# Patient Record
Sex: Female | Born: 1955 | Hispanic: Yes | Marital: Married | Smoking: Never smoker
Health system: Southern US, Community
[De-identification: ages and names within clinical notes are randomized; demographics above are authoritative.]

## PROBLEM LIST (undated history)

## (undated) DIAGNOSIS — R51 Headache: Secondary | ICD-10-CM

## (undated) DIAGNOSIS — H539 Unspecified visual disturbance: Secondary | ICD-10-CM

## (undated) DIAGNOSIS — N1832 Chronic kidney disease, stage 3b: Secondary | ICD-10-CM

## (undated) DIAGNOSIS — E119 Type 2 diabetes mellitus without complications: Secondary | ICD-10-CM

## (undated) DIAGNOSIS — E7800 Pure hypercholesterolemia, unspecified: Secondary | ICD-10-CM

## (undated) DIAGNOSIS — Z8616 Personal history of COVID-19: Secondary | ICD-10-CM

## (undated) DIAGNOSIS — G479 Sleep disorder, unspecified: Secondary | ICD-10-CM

## (undated) DIAGNOSIS — E538 Deficiency of other specified B group vitamins: Secondary | ICD-10-CM

## (undated) DIAGNOSIS — R519 Headache, unspecified: Secondary | ICD-10-CM

## (undated) DIAGNOSIS — I10 Essential (primary) hypertension: Secondary | ICD-10-CM

## (undated) HISTORY — PX: CHOLECYSTECTOMY: SHX55

## (undated) HISTORY — PX: TUBAL LIGATION: SHX77

---

## 2012-03-21 ENCOUNTER — Ambulatory Visit: Payer: Self-pay | Admitting: Family Medicine

## 2014-04-23 ENCOUNTER — Emergency Department: Payer: Self-pay | Admitting: Emergency Medicine

## 2014-05-02 ENCOUNTER — Emergency Department: Payer: Self-pay | Admitting: Emergency Medicine

## 2016-09-01 DIAGNOSIS — Z01419 Encounter for gynecological examination (general) (routine) without abnormal findings: Secondary | ICD-10-CM | POA: Diagnosis not present

## 2016-09-01 DIAGNOSIS — E782 Mixed hyperlipidemia: Secondary | ICD-10-CM | POA: Diagnosis not present

## 2016-09-01 DIAGNOSIS — Z23 Encounter for immunization: Secondary | ICD-10-CM | POA: Diagnosis not present

## 2016-09-01 DIAGNOSIS — E119 Type 2 diabetes mellitus without complications: Secondary | ICD-10-CM | POA: Diagnosis not present

## 2016-09-01 DIAGNOSIS — I10 Essential (primary) hypertension: Secondary | ICD-10-CM | POA: Diagnosis not present

## 2016-09-01 DIAGNOSIS — Z7689 Persons encountering health services in other specified circumstances: Secondary | ICD-10-CM | POA: Diagnosis not present

## 2016-09-06 ENCOUNTER — Other Ambulatory Visit: Payer: Self-pay | Admitting: Internal Medicine

## 2016-09-06 DIAGNOSIS — Z1231 Encounter for screening mammogram for malignant neoplasm of breast: Secondary | ICD-10-CM

## 2016-10-04 ENCOUNTER — Ambulatory Visit: Payer: Self-pay | Attending: Internal Medicine

## 2016-11-08 DIAGNOSIS — I10 Essential (primary) hypertension: Secondary | ICD-10-CM | POA: Diagnosis not present

## 2016-11-08 DIAGNOSIS — K0889 Other specified disorders of teeth and supporting structures: Secondary | ICD-10-CM | POA: Diagnosis not present

## 2016-12-01 DIAGNOSIS — Z23 Encounter for immunization: Secondary | ICD-10-CM | POA: Diagnosis not present

## 2016-12-01 DIAGNOSIS — E7800 Pure hypercholesterolemia, unspecified: Secondary | ICD-10-CM | POA: Diagnosis not present

## 2016-12-01 DIAGNOSIS — E119 Type 2 diabetes mellitus without complications: Secondary | ICD-10-CM | POA: Diagnosis not present

## 2016-12-01 DIAGNOSIS — I10 Essential (primary) hypertension: Secondary | ICD-10-CM | POA: Diagnosis not present

## 2016-12-01 DIAGNOSIS — E782 Mixed hyperlipidemia: Secondary | ICD-10-CM | POA: Diagnosis not present

## 2016-12-28 ENCOUNTER — Ambulatory Visit: Payer: Self-pay | Attending: Internal Medicine

## 2017-01-30 ENCOUNTER — Other Ambulatory Visit: Payer: Self-pay | Admitting: Internal Medicine

## 2017-01-30 DIAGNOSIS — R519 Headache, unspecified: Secondary | ICD-10-CM

## 2017-01-30 DIAGNOSIS — E119 Type 2 diabetes mellitus without complications: Secondary | ICD-10-CM | POA: Diagnosis not present

## 2017-01-30 DIAGNOSIS — R51 Headache: Secondary | ICD-10-CM | POA: Diagnosis not present

## 2017-01-30 DIAGNOSIS — E7800 Pure hypercholesterolemia, unspecified: Secondary | ICD-10-CM | POA: Diagnosis not present

## 2017-01-30 DIAGNOSIS — H539 Unspecified visual disturbance: Secondary | ICD-10-CM

## 2017-01-30 DIAGNOSIS — I10 Essential (primary) hypertension: Secondary | ICD-10-CM | POA: Diagnosis not present

## 2017-01-30 DIAGNOSIS — R8290 Unspecified abnormal findings in urine: Secondary | ICD-10-CM | POA: Diagnosis not present

## 2017-01-30 DIAGNOSIS — Z1211 Encounter for screening for malignant neoplasm of colon: Secondary | ICD-10-CM | POA: Diagnosis not present

## 2017-01-31 ENCOUNTER — Emergency Department
Admission: EM | Admit: 2017-01-31 | Discharge: 2017-01-31 | Disposition: A | Discharge status: Home / Self Care | Payer: Commercial Managed Care - PPO | Attending: Emergency Medicine | Admitting: Emergency Medicine

## 2017-01-31 ENCOUNTER — Encounter: Payer: Self-pay | Admitting: Emergency Medicine

## 2017-01-31 ENCOUNTER — Emergency Department: Payer: Commercial Managed Care - PPO

## 2017-01-31 DIAGNOSIS — R739 Hyperglycemia, unspecified: Secondary | ICD-10-CM

## 2017-01-31 DIAGNOSIS — R42 Dizziness and giddiness: Secondary | ICD-10-CM | POA: Diagnosis present

## 2017-01-31 DIAGNOSIS — Z7984 Long term (current) use of oral hypoglycemic drugs: Secondary | ICD-10-CM | POA: Diagnosis not present

## 2017-01-31 DIAGNOSIS — N390 Urinary tract infection, site not specified: Secondary | ICD-10-CM | POA: Insufficient documentation

## 2017-01-31 DIAGNOSIS — I10 Essential (primary) hypertension: Secondary | ICD-10-CM | POA: Insufficient documentation

## 2017-01-31 DIAGNOSIS — R51 Headache: Secondary | ICD-10-CM | POA: Diagnosis not present

## 2017-01-31 DIAGNOSIS — Z79899 Other long term (current) drug therapy: Secondary | ICD-10-CM | POA: Diagnosis not present

## 2017-01-31 DIAGNOSIS — E1165 Type 2 diabetes mellitus with hyperglycemia: Secondary | ICD-10-CM | POA: Diagnosis not present

## 2017-01-31 DIAGNOSIS — Z794 Long term (current) use of insulin: Secondary | ICD-10-CM | POA: Diagnosis not present

## 2017-01-31 HISTORY — DX: Type 2 diabetes mellitus without complications: E11.9

## 2017-01-31 HISTORY — DX: Essential (primary) hypertension: I10

## 2017-01-31 LAB — URINALYSIS, COMPLETE (UACMP) WITH MICROSCOPIC
BILIRUBIN URINE: NEGATIVE
Glucose, UA: >=500 mg/dL — AB
HGB URINE DIPSTICK: NEGATIVE
KETONES UR: NEGATIVE mg/dL
NITRITE: POSITIVE — AB
Protein, ur: NEGATIVE mg/dL
SPECIFIC GRAVITY, URINE: 1.009 (ref 1.005–1.030)
pH: 6 (ref 5.0–8.0)

## 2017-01-31 LAB — BASIC METABOLIC PANEL
Anion gap: 8 (ref 5–15)
BUN: 13 mg/dL (ref 6–20)
CHLORIDE: 99 mmol/L — AB (ref 101–111)
CO2: 24 mmol/L (ref 22–32)
Calcium: 9 mg/dL (ref 8.9–10.3)
Creatinine, Ser: 1.03 mg/dL — ABNORMAL HIGH (ref 0.44–1.00)
GFR calc non Af Amer: 58 mL/min — ABNORMAL LOW (ref >60)
Glucose, Bld: 452 mg/dL — ABNORMAL HIGH (ref 65–99)
POTASSIUM: 3.6 mmol/L (ref 3.5–5.1)
Sodium: 131 mmol/L — ABNORMAL LOW (ref 135–145)

## 2017-01-31 LAB — GLUCOSE, CAPILLARY
GLUCOSE-CAPILLARY: 473 mg/dL — AB (ref 65–99)
Glucose-Capillary: 378 mg/dL — ABNORMAL HIGH (ref 65–99)

## 2017-01-31 LAB — CBC
HCT: 39.7 % (ref 35.0–47.0)
Hemoglobin: 13.9 g/dL (ref 12.0–16.0)
MCH: 31.8 pg (ref 26.0–34.0)
MCHC: 35.1 g/dL (ref 32.0–36.0)
MCV: 90.7 fL (ref 80.0–100.0)
Platelets: 246 10*3/uL (ref 150–440)
RBC: 4.38 MIL/uL (ref 3.80–5.20)
RDW: 12.7 % (ref 11.5–14.5)
WBC: 8.8 10*3/uL (ref 3.6–11.0)

## 2017-01-31 MED ORDER — CEPHALEXIN 500 MG PO CAPS: 500.0000 mg | ORAL_CAPSULE | Freq: Three times a day (TID) | ORAL | 0 refills | Status: AC

## 2017-01-31 MED ORDER — SODIUM CHLORIDE 0.9 % IV BOLUS (SEPSIS)
1000.0000 mL | Freq: Once | INTRAVENOUS | Status: AC
  Administered 2017-01-31: 1000 mL via INTRAVENOUS

## 2017-01-31 MED ORDER — METOCLOPRAMIDE HCL 5 MG/ML IJ SOLN
10.0000 mg | Freq: Once | INTRAMUSCULAR | Status: AC
  Administered 2017-01-31: 10 mg via INTRAVENOUS
  Filled 2017-01-31: qty 2

## 2017-01-31 MED ORDER — CEPHALEXIN 500 MG PO CAPS
500.0000 mg | ORAL_CAPSULE | Freq: Once | ORAL | Status: AC
  Administered 2017-01-31: 500 mg via ORAL
  Filled 2017-01-31: qty 1

## 2017-01-31 NOTE — ED Notes (Signed)
Interpreter, Ronnald Collum used for discharge

## 2017-01-31 NOTE — ED Provider Notes (Signed)
[facility] Emergency Department Provider Note  ____________________________________________   First MD Initiated Contact with Patient 01/31/17 1008     (approximate)  I have reviewed the triage vital signs and the nursing notes.   HISTORY  Chief Complaint Dizziness and Headache  Translator, Maritza at the bedside. HPI Michele Sanford is a 61 y.o. female with a history of diabetes and hypertension who is presenting to the emergency department today with 3-4 days of left-sided headache which she describes as a 10 out of 10. She says that the headache started mild and has progressed gradually to its current state of a 10 out of 10, sharp pain. She says it is associated with nausea and vomiting. Also with horizontal diplopia. Denies any weakness or numbness.  Patient also has recently been prescribed the toes of by her primary care doctor but has not been authorized by her insurance as of yet. She is still currently taking her glipizide. Has also taken 2 medicines that her husband ordered from an infomercial on television that she says is "for diabetes." However, no other further details are known about these medications and the medications are at home and nobody is at home at this time to be called to read the labels.   Past Medical History:  Diagnosis Date  . Diabetes mellitus without complication ([place])   . Hypertension     There are no active problems to display for this patient.   Past Surgical History:  Procedure Laterality Date  . CHOLECYSTECTOMY      Prior to Admission medications   Medication Sig Start Date End Date Taking? Authorizing Provider  amLODipine-benazepril (LOTREL) 5-10 MG capsule Take 1 capsule by mouth daily. 01/25/17  Yes [provider]  atorvastatin (LIPITOR) 20 MG tablet Take 20 mg by mouth daily. 11/18/16  Yes [provider]  cyclobenzaprine (FLEXERIL) 10 MG tablet Take 10 mg by mouth daily as needed for muscle  spasms.   Yes [provider]  glipiZIDE (GLUCOTROL XL) 10 MG 24 hr tablet Take 10 mg by mouth 2 (two) times daily. 11/08/16   [provider]  JANUVIA 100 MG tablet Take 100 mg by mouth daily. 12/04/16   [provider]    Allergies Patient has no known allergies.  No family history on file.  Social History Social History  Substance Use Topics  . Smoking status: Never Smoker  . Smokeless tobacco: Not on file  . Alcohol use Not on file    Review of Systems  Constitutional: No fever Eyes:  As above ENT: No sore throat. Cardiovascular: Denies chest pain. Respiratory: Denies shortness of breath. Gastrointestinal: No abdominal pain.   No diarrhea.  No constipation. Genitourinary: Negative for dysuria. Musculoskeletal: Negative for back pain. Skin: Negative for rash. Neurological: Negative for headaches, focal weakness or numbness.   ____________________________________________   PHYSICAL EXAM:  VITAL SIGNS: ED Triage Vitals  Enc Vitals Group     BP 01/31/17 0959 (!) 197/84     Pulse Rate 01/31/17 0959 89     Resp 01/31/17 0959 20     Temp 01/31/17 0959 98.2 F (36.8 C)     Temp Source 01/31/17 0959 Oral     SpO2 01/31/17 0959 100 %     Weight 01/31/17 0952 130 lb (59 kg)     Height 01/31/17 0952 5\' 3"  (1.6 m)     Head Circumference --      Peak Flow --  Pain Score 01/31/17 0951 7     Pain Loc --      Pain Edu? --      Excl. in [place]? --     Constitutional: Alert and oriented. Well appearing and in no acute distress. Eyes: Conjunctivae are normal.  Pupils are PERRLA. Extraocular muscles are intact. Head: Atraumatic. Nose: No congestion/rhinnorhea. Mouth/Throat: Mucous membranes are moist.  Neck: No stridor.   Cardiovascular: Normal rate, regular rhythm. Grossly normal heart sounds.  Respiratory: Normal respiratory effort.  No retractions. Lungs CTAB. Gastrointestinal: Soft and nontender. No distention. Musculoskeletal: No lower  extremity tenderness nor edema.  No joint effusions. Neurologic:  Normal speech and language. No gross focal neurologic deficits are appreciated. Skin:  Skin is warm, dry and intact. No rash noted. Psychiatric: Mood and affect are normal. Speech and behavior are normal.  ____________________________________________   LABS (all labs ordered are listed, but only abnormal results are displayed)  Labs Reviewed  BASIC METABOLIC PANEL - Abnormal; Notable for the following:       Result Value   Sodium 131 (*)    Chloride 99 (*)    Glucose, Bld 452 (*)    Creatinine, Ser 1.03 (*)    GFR calc non Af Amer 58 (*)    All other components within normal limits  URINALYSIS, COMPLETE (UACMP) WITH MICROSCOPIC - Abnormal; Notable for the following:    Color, Urine YELLOW (*)    APPearance HAZY (*)    Glucose, UA >=500 (*)    Nitrite POSITIVE (*)    Leukocytes, UA TRACE (*)    Bacteria, UA FEW (*)    Squamous Epithelial / LPF 6-30 (*)    All other components within normal limits  GLUCOSE, CAPILLARY - Abnormal; Notable for the following:    Glucose-Capillary 473 (*)    All other components within normal limits  GLUCOSE, CAPILLARY - Abnormal; Notable for the following:    Glucose-Capillary 378 (*)    All other components within normal limits  URINE CULTURE  CBC  CBG MONITORING, ED   ____________________________________________  EKG   ____________________________________________  RADIOLOGY  No acute findings on the CT of the brain evidence of old low density infarct within the right superior cerebellum. ____________________________________________   PROCEDURES  Procedure(s) performed:   Procedures  Critical Care performed:   ____________________________________________   INITIAL IMPRESSION / ASSESSMENT AND PLAN / ED COURSE  Pertinent labs & imaging results that were available during my care of the patient were reviewed by me and considered in my medical decision making (see  chart for details).  ----------------------------------------- 12:52 PM on 01/31/2017 -----------------------------------------  Patient is asymptomatic at this time. Vision issues have resolved as well as the headache. Sugar has come down with IV fluids. The patient and daughter who is at the bedside stated they would be able to get the [facility] tomorrow. Symptoms likely related to high blood sugar in addition to the urinary tract infection. We discussed the lab results as well as the diagnosis and treatment plans. The patient as well as family are understanding and willing to comply.      ____________________________________________   FINAL CLINICAL IMPRESSION(S) / ED DIAGNOSES  Hyperglycemia. UTI.    NEW MEDICATIONS STARTED DURING THIS VISIT:  New Prescriptions   No medications on file     Note:  This document was prepared using Dragon voice recognition software and may include unintentional dictation errors.     Orbie Pyo, MD 01/31/17 [phone]

## 2017-01-31 NOTE — ED Notes (Signed)
Pt states that blurry vision is better.

## 2017-01-31 NOTE — ED Triage Notes (Signed)
Pt c/o HA with dizziness for the past 3-4 days, states she was seen by PCP yesterday and her b/p was elevated and was instructed if not feeling any better today to come to the ED.Marland Kitchen

## 2017-01-31 NOTE — ED Notes (Signed)
Pt alert and oriented X4, active, cooperative, pt in NAD. RR even and unlabored, color WNL.  Pt informed to return if any life threatening symptoms occur.   

## 2017-01-31 NOTE — ED Notes (Addendum)
Blurry vision and intermittent headache X 3 days. Pt was prescribed Victoza a few days ago and has not began to take it. Unsure if patient has been compliant with her previous diabetes medications. Does not take blood sugars at home. Diabetes X 3 years. Pt hit her head on car door today due to the blurry vision, no LOC. Interpreter used for assessment, Landscape architect. EDP at bedside.

## 2017-02-03 LAB — URINE CULTURE: Culture: >=100000 — AB

## 2017-02-09 ENCOUNTER — Ambulatory Visit: Payer: Commercial Managed Care - PPO

## 2017-02-16 ENCOUNTER — Ambulatory Visit
Admission: RE | Admit: 2017-02-16 | Discharge: 2017-02-16 | Disposition: A | Discharge status: Home / Self Care | Payer: Commercial Managed Care - PPO | Source: Ambulatory Visit | Attending: Internal Medicine | Admitting: Internal Medicine

## 2017-02-16 ENCOUNTER — Emergency Department
Admission: EM | Admit: 2017-02-16 | Discharge: 2017-02-16 | Discharge status: Absent without leave | Payer: Commercial Managed Care - PPO | Source: Home / Self Care

## 2017-02-16 DIAGNOSIS — R51 Headache: Secondary | ICD-10-CM | POA: Diagnosis not present

## 2017-02-16 DIAGNOSIS — H539 Unspecified visual disturbance: Secondary | ICD-10-CM | POA: Diagnosis not present

## 2017-02-16 DIAGNOSIS — R9089 Other abnormal findings on diagnostic imaging of central nervous system: Secondary | ICD-10-CM | POA: Insufficient documentation

## 2017-02-16 DIAGNOSIS — R519 Headache, unspecified: Secondary | ICD-10-CM

## 2017-02-16 DIAGNOSIS — R42 Dizziness and giddiness: Secondary | ICD-10-CM | POA: Diagnosis not present

## 2017-02-16 NOTE — ED Notes (Signed)
interpreter requested.

## 2017-03-23 DIAGNOSIS — R51 Headache: Secondary | ICD-10-CM | POA: Diagnosis not present

## 2017-03-23 DIAGNOSIS — H539 Unspecified visual disturbance: Secondary | ICD-10-CM | POA: Diagnosis not present

## 2017-04-05 ENCOUNTER — Other Ambulatory Visit: Payer: Self-pay | Admitting: Acute Care

## 2017-04-05 DIAGNOSIS — R519 Headache, unspecified: Secondary | ICD-10-CM

## 2017-04-05 DIAGNOSIS — R51 Headache: Principal | ICD-10-CM

## 2017-04-05 DIAGNOSIS — H539 Unspecified visual disturbance: Secondary | ICD-10-CM

## 2017-04-06 DIAGNOSIS — B351 Tinea unguium: Secondary | ICD-10-CM | POA: Diagnosis not present

## 2017-04-06 DIAGNOSIS — Z9119 Patient's noncompliance with other medical treatment and regimen: Secondary | ICD-10-CM | POA: Diagnosis not present

## 2017-04-06 DIAGNOSIS — I10 Essential (primary) hypertension: Secondary | ICD-10-CM | POA: Diagnosis not present

## 2017-04-06 DIAGNOSIS — E119 Type 2 diabetes mellitus without complications: Secondary | ICD-10-CM | POA: Diagnosis not present

## 2017-05-15 DIAGNOSIS — I6523 Occlusion and stenosis of bilateral carotid arteries: Secondary | ICD-10-CM | POA: Diagnosis not present

## 2017-05-15 DIAGNOSIS — R51 Headache: Secondary | ICD-10-CM | POA: Diagnosis not present

## 2017-06-06 ENCOUNTER — Encounter: Payer: Self-pay | Admitting: *Deleted

## 2017-06-07 ENCOUNTER — Encounter
Admission: RE | Disposition: A | Discharge status: Home / Self Care | Payer: Self-pay | Source: Ambulatory Visit | Attending: Gastroenterology

## 2017-06-07 ENCOUNTER — Ambulatory Visit: Payer: Commercial Managed Care - PPO | Admitting: Anesthesiology

## 2017-06-07 ENCOUNTER — Ambulatory Visit
Admission: RE | Admit: 2017-06-07 | Discharge: 2017-06-07 | Disposition: A | Discharge status: Home / Self Care | Payer: Commercial Managed Care - PPO | Source: Ambulatory Visit | Attending: Gastroenterology | Admitting: Gastroenterology

## 2017-06-07 DIAGNOSIS — Z5309 Procedure and treatment not carried out because of other contraindication: Secondary | ICD-10-CM | POA: Diagnosis not present

## 2017-06-07 DIAGNOSIS — Z1211 Encounter for screening for malignant neoplasm of colon: Secondary | ICD-10-CM | POA: Diagnosis not present

## 2017-06-07 HISTORY — DX: Headache, unspecified: R51.9

## 2017-06-07 HISTORY — DX: Headache: R51

## 2017-06-07 HISTORY — DX: Pure hypercholesterolemia, unspecified: E78.00

## 2017-06-07 SURGERY — COLONOSCOPY WITH PROPOFOL
Anesthesia: General

## 2017-06-07 MED ORDER — SODIUM CHLORIDE 0.9 % IV SOLN: INTRAVENOUS | Status: DC

## 2017-06-07 MED ORDER — PROPOFOL 500 MG/50ML IV EMUL
INTRAVENOUS | Status: AC
  Filled 2017-06-07: qty 50

## 2017-06-07 MED ORDER — EPHEDRINE SULFATE 50 MG/ML IJ SOLN
INTRAMUSCULAR | Status: AC
  Filled 2017-06-07: qty 1

## 2017-06-07 MED ORDER — SODIUM CHLORIDE 0.9 % IJ SOLN
INTRAMUSCULAR | Status: AC
  Filled 2017-06-07: qty 10

## 2017-06-07 MED ORDER — PHENYLEPHRINE HCL 10 MG/ML IJ SOLN
INTRAMUSCULAR | Status: AC
  Filled 2017-06-07: qty 1

## 2017-06-07 MED ORDER — FENTANYL CITRATE (PF) 100 MCG/2ML IJ SOLN
INTRAMUSCULAR | Status: AC
  Filled 2017-06-07: qty 2

## 2017-06-07 MED ORDER — LIDOCAINE HCL (PF) 2 % IJ SOLN
INTRAMUSCULAR | Status: AC
  Filled 2017-06-07: qty 10

## 2017-06-07 MED ORDER — PROPOFOL 10 MG/ML IV BOLUS
INTRAVENOUS | Status: AC
  Filled 2017-06-07: qty 20

## 2017-06-07 NOTE — H&P (Signed)
Patient arrived today and through the translator understood that she only drank some clear liquids yesterday but did not take her prep for her colonoscopy. As such I explained through the interpreter that there was a need to do the prep and explained how the day before there was dietary limitations as well as possibly a day before that and then doing a colonoscopy prep to help clear the bowel of debris so that access could be done. We will contact her through my office to help her get rid arrange for this. She did not have any medical services provided such as IVs down etc., And was still in her street clothes.

## 2017-06-28 ENCOUNTER — Ambulatory Visit
Admission: RE | Admit: 2017-06-28 | Discharge: 2017-06-28 | Disposition: A | Discharge status: Home / Self Care | Payer: Commercial Managed Care - PPO | Source: Ambulatory Visit | Attending: Acute Care | Admitting: Acute Care

## 2017-06-28 DIAGNOSIS — H539 Unspecified visual disturbance: Secondary | ICD-10-CM

## 2017-06-28 DIAGNOSIS — R930 Abnormal findings on diagnostic imaging of skull and head, not elsewhere classified: Secondary | ICD-10-CM | POA: Diagnosis not present

## 2017-06-28 DIAGNOSIS — R51 Headache: Secondary | ICD-10-CM | POA: Insufficient documentation

## 2017-06-28 DIAGNOSIS — R519 Headache, unspecified: Secondary | ICD-10-CM

## 2017-06-28 DIAGNOSIS — R42 Dizziness and giddiness: Secondary | ICD-10-CM | POA: Diagnosis not present

## 2017-06-28 LAB — POCT I-STAT CREATININE: Creatinine, Ser: 1.2 mg/dL — ABNORMAL HIGH (ref 0.44–1.00)

## 2017-06-28 MED ORDER — GADOBENATE DIMEGLUMINE 529 MG/ML IV SOLN
15.0000 mL | Freq: Once | INTRAVENOUS | Status: AC | PRN
  Administered 2017-06-28: 11 mL via INTRAVENOUS

## 2017-08-16 DIAGNOSIS — E1165 Type 2 diabetes mellitus with hyperglycemia: Secondary | ICD-10-CM | POA: Diagnosis not present

## 2017-08-16 DIAGNOSIS — Z1211 Encounter for screening for malignant neoplasm of colon: Secondary | ICD-10-CM | POA: Diagnosis not present

## 2017-09-13 ENCOUNTER — Other Ambulatory Visit: Payer: Self-pay | Admitting: Internal Medicine

## 2017-09-13 DIAGNOSIS — I10 Essential (primary) hypertension: Secondary | ICD-10-CM | POA: Diagnosis not present

## 2017-09-13 DIAGNOSIS — Z1231 Encounter for screening mammogram for malignant neoplasm of breast: Secondary | ICD-10-CM

## 2017-09-13 DIAGNOSIS — E7800 Pure hypercholesterolemia, unspecified: Secondary | ICD-10-CM | POA: Diagnosis not present

## 2017-09-13 DIAGNOSIS — E119 Type 2 diabetes mellitus without complications: Secondary | ICD-10-CM | POA: Diagnosis not present

## 2017-09-27 ENCOUNTER — Ambulatory Visit
Admission: RE | Admit: 2017-09-27 | Discharge: 2017-09-27 | Disposition: A | Discharge status: Home / Self Care | Payer: Commercial Managed Care - PPO | Source: Ambulatory Visit | Attending: Internal Medicine | Admitting: Internal Medicine

## 2017-09-27 DIAGNOSIS — R928 Other abnormal and inconclusive findings on diagnostic imaging of breast: Secondary | ICD-10-CM | POA: Insufficient documentation

## 2017-09-27 DIAGNOSIS — Z1231 Encounter for screening mammogram for malignant neoplasm of breast: Secondary | ICD-10-CM | POA: Diagnosis not present

## 2017-10-01 ENCOUNTER — Other Ambulatory Visit: Payer: Self-pay | Admitting: Internal Medicine

## 2017-10-01 DIAGNOSIS — N6489 Other specified disorders of breast: Secondary | ICD-10-CM

## 2017-10-01 DIAGNOSIS — R928 Other abnormal and inconclusive findings on diagnostic imaging of breast: Secondary | ICD-10-CM

## 2017-10-08 ENCOUNTER — Ambulatory Visit
Admission: RE | Admit: 2017-10-08 | Discharge: 2017-10-08 | Disposition: A | Discharge status: Home / Self Care | Payer: Commercial Managed Care - PPO | Source: Ambulatory Visit | Attending: Internal Medicine | Admitting: Internal Medicine

## 2017-10-08 DIAGNOSIS — R928 Other abnormal and inconclusive findings on diagnostic imaging of breast: Secondary | ICD-10-CM | POA: Diagnosis not present

## 2017-10-08 DIAGNOSIS — N6489 Other specified disorders of breast: Secondary | ICD-10-CM | POA: Insufficient documentation

## 2017-10-18 ENCOUNTER — Encounter: Payer: Self-pay | Admitting: *Deleted

## 2017-10-19 ENCOUNTER — Ambulatory Visit: Payer: Commercial Managed Care - PPO | Admitting: Certified Registered"

## 2017-10-19 ENCOUNTER — Ambulatory Visit
Admission: RE | Admit: 2017-10-19 | Discharge: 2017-10-19 | Disposition: A | Discharge status: Home / Self Care | Payer: Commercial Managed Care - PPO | Source: Ambulatory Visit | Attending: Gastroenterology | Admitting: Gastroenterology

## 2017-10-19 ENCOUNTER — Encounter
Admission: RE | Disposition: A | Discharge status: Home / Self Care | Payer: Self-pay | Source: Ambulatory Visit | Attending: Gastroenterology

## 2017-10-19 DIAGNOSIS — E119 Type 2 diabetes mellitus without complications: Secondary | ICD-10-CM | POA: Diagnosis not present

## 2017-10-19 DIAGNOSIS — D123 Benign neoplasm of transverse colon: Secondary | ICD-10-CM | POA: Diagnosis not present

## 2017-10-19 DIAGNOSIS — Z1211 Encounter for screening for malignant neoplasm of colon: Secondary | ICD-10-CM | POA: Diagnosis not present

## 2017-10-19 DIAGNOSIS — E7800 Pure hypercholesterolemia, unspecified: Secondary | ICD-10-CM | POA: Diagnosis not present

## 2017-10-19 DIAGNOSIS — I10 Essential (primary) hypertension: Secondary | ICD-10-CM | POA: Insufficient documentation

## 2017-10-19 DIAGNOSIS — Z7984 Long term (current) use of oral hypoglycemic drugs: Secondary | ICD-10-CM | POA: Diagnosis not present

## 2017-10-19 DIAGNOSIS — K635 Polyp of colon: Secondary | ICD-10-CM | POA: Diagnosis not present

## 2017-10-19 DIAGNOSIS — Z79899 Other long term (current) drug therapy: Secondary | ICD-10-CM | POA: Diagnosis not present

## 2017-10-19 HISTORY — DX: Unspecified visual disturbance: H53.9

## 2017-10-19 HISTORY — PX: COLONOSCOPY WITH PROPOFOL: SHX5780

## 2017-10-19 HISTORY — DX: Sleep disorder, unspecified: G47.9

## 2017-10-19 LAB — GLUCOSE, CAPILLARY: GLUCOSE-CAPILLARY: 144 mg/dL — AB (ref 65–99)

## 2017-10-19 SURGERY — COLONOSCOPY WITH PROPOFOL
Anesthesia: General

## 2017-10-19 MED ORDER — GLYCOPYRROLATE 0.2 MG/ML IJ SOLN
INTRAMUSCULAR | Status: DC | PRN
  Administered 2017-10-19: 0.1 mg via INTRAVENOUS

## 2017-10-19 MED ORDER — LIDOCAINE HCL (PF) 2 % IJ SOLN
INTRAMUSCULAR | Status: AC
Start: 2017-10-19 — End: 2017-10-19
  Filled 2017-10-19: qty 10

## 2017-10-19 MED ORDER — GLYCOPYRROLATE 0.2 MG/ML IJ SOLN
INTRAMUSCULAR | Status: AC
  Filled 2017-10-19: qty 1

## 2017-10-19 MED ORDER — LIDOCAINE HCL (CARDIAC) 20 MG/ML IV SOLN
INTRAVENOUS | Status: DC | PRN
  Administered 2017-10-19: 50 mg via INTRATRACHEAL

## 2017-10-19 MED ORDER — PROPOFOL 500 MG/50ML IV EMUL
INTRAVENOUS | Status: DC | PRN
  Administered 2017-10-19: 125 ug/kg/min via INTRAVENOUS

## 2017-10-19 MED ORDER — PROPOFOL 10 MG/ML IV BOLUS
INTRAVENOUS | Status: DC | PRN
  Administered 2017-10-19 (×2): 50 mg via INTRAVENOUS
  Administered 2017-10-19: 100 mg via INTRAVENOUS

## 2017-10-19 MED ORDER — PROPOFOL 500 MG/50ML IV EMUL
INTRAVENOUS | Status: AC
  Filled 2017-10-19: qty 50

## 2017-10-19 MED ORDER — PROPOFOL 10 MG/ML IV BOLUS
INTRAVENOUS | Status: AC
  Filled 2017-10-19: qty 20

## 2017-10-19 MED ORDER — SODIUM CHLORIDE 0.9 % IV SOLN: INTRAVENOUS | Status: DC

## 2017-10-19 MED ORDER — SODIUM CHLORIDE 0.9 % IV SOLN
INTRAVENOUS | Status: DC
  Administered 2017-10-19: 11:00:00 via INTRAVENOUS

## 2017-10-19 NOTE — H&P (Signed)
Outpatient short stay form Pre-procedure 10/19/2017 10:18 AM Lollie Sails MD  Primary Physician: Dr Tracie Harrier  Reason for visit: Colonoscopy  History of present illness: Patient is a 62 year old female presenting today for a screening colonoscopy.  Assistance of interpreter.  This would be her first colonoscopy.  She tolerated her prep well.  She takes no aspirin or blood thinning agent.  She tolerated prep well.  She denies use of any aspirin or blood thinning agent.    Current Facility-Administered Medications:  .  0.9 %  sodium chloride infusion, , Intravenous, Continuous, Lollie Sails, MD .  0.9 %  sodium chloride infusion, , Intravenous, Continuous, Lollie Sails, MD  Medications Prior to Admission  Medication Sig Dispense Refill Last Dose  . JANUVIA 100 MG tablet Take 100 mg by mouth daily.  0 10/18/2017 at Unknown time  . liraglutide (VICTOZA) 18 MG/3ML SOPN Inject 0.6 mg into the skin daily.   10/18/2017 at Unknown time  . lisinopril-hydrochlorothiazide (PRINZIDE,ZESTORETIC) 20-12.5 MG tablet Take 1 tablet by mouth daily.   10/18/2017 at Unknown time  . amLODipine-benazepril (LOTREL) 5-10 MG capsule Take 1 capsule by mouth daily.  0 01/31/2017 at 0800  . atorvastatin (LIPITOR) 20 MG tablet Take 20 mg by mouth daily.  0 01/30/2017 at Unknown time  . cyclobenzaprine (FLEXERIL) 10 MG tablet Take 10 mg by mouth daily as needed for muscle spasms.   PRN at PRN  . glipiZIDE (GLUCOTROL XL) 10 MG 24 hr tablet Take 10 mg by mouth 2 (two) times daily.  0 Not Taking at Unknown time     No Known Allergies   Past Medical History:  Diagnosis Date  . Diabetes mellitus without complication ([place])   . Elevated cholesterol   . Headache   . Hypertension   . Sleep disorder   . Visual disturbances     Review of systems:      Physical Exam    Heart and lungs: Regular rate and rhythm without rub or gallop, lungs are bilaterally clear.    HEENT: Normocephalic atraumatic  eyes are anicteric    Other:    Pertinant exam for procedure: Soft nontender nondistended bowel sounds positive normoactive    Planned proceedures: Colonoscopy and indicated procedures. I have discussed the risks benefits and complications of procedures to include not limited to bleeding, infection, perforation and the risk of sedation and the patient wishes to proceed.    Lollie Sails, MD Gastroenterology 10/19/2017  10:18 AM

## 2017-10-19 NOTE — Anesthesia Postprocedure Evaluation (Signed)
Anesthesia Post Note  Patient: Michele Sanford  Procedure(s) Performed: COLONOSCOPY WITH PROPOFOL (N/A )  Patient location during evaluation: Endoscopy Anesthesia Type: General Level of consciousness: awake and alert, oriented and patient cooperative Pain management: satisfactory to patient Vital Signs Assessment: post-procedure vital signs reviewed and stable Respiratory status: spontaneous breathing and respiratory function stable Cardiovascular status: blood pressure returned to baseline and stable Postop Assessment: no headache, no backache, patient able to bend at knees, adequate PO intake and no apparent nausea or vomiting Anesthetic complications: no     Last Vitals:  Vitals Value Taken Time  BP 131/74 10/19/2017 11:30 AM  Temp 36.3 C 10/19/2017 11:30 AM  Pulse 100 10/19/2017 11:32 AM  Resp 13 10/19/2017 11:32 AM  SpO2 98 % 10/19/2017 11:32 AM  Vitals shown include unvalidated device data.  Last Pain:  Vitals:   10/19/17 1130  TempSrc:   PainSc: [name]

## 2017-10-19 NOTE — Transfer of Care (Signed)
Immediate Anesthesia Transfer of Care Note  Patient: Michele Sanford  Procedure(s) Performed: COLONOSCOPY WITH PROPOFOL (N/A )  Patient Location: PACU  Anesthesia Type:General  Level of Consciousness: drowsy and patient cooperative  Airway & Oxygen Therapy: Patient Spontanous Breathing  Post-op Assessment: Report given to RN, Post -op Vital signs reviewed and stable and Patient moving all extremities  Post vital signs: Reviewed and stable  Last Vitals:  Vitals Value Taken Time  BP    Temp    Pulse 101 10/19/2017 11:31 AM  Resp 13 10/19/2017 11:31 AM  SpO2 98 % 10/19/2017 11:31 AM  Vitals shown include unvalidated device data.  Last Pain:  Vitals:   10/19/17 1130  TempSrc:   PainSc: (P) Asleep         Complications: No apparent anesthesia complications

## 2017-10-19 NOTE — Anesthesia Post-op Follow-up Note (Signed)
Anesthesia QCDR form completed.        

## 2017-10-19 NOTE — Op Note (Signed)
[facility] Gastroenterology Patient Name: Michele Sanford Procedure Date: 10/19/2017 10:50 AM MRN: [id number] Account #: [id number] Date of Birth: 05-Nov-1955 Admit Type: Outpatient Age: 62 Room: [facility] ENDO ROOM 3 Gender: Female Note Status: Finalized Procedure:            Colonoscopy Indications:          Screening for colorectal malignant neoplasm Providers:            Lollie Sails, MD Referring MD:         Tracie Harrier, MD (Referring MD) Medicines:            Monitored Anesthesia Care Complications:        No immediate complications. Procedure:            Pre-Anesthesia Assessment:                       - ASA Grade Assessment: II - A patient with mild                        systemic disease.                       After obtaining informed consent, the colonoscope was                        passed under direct vision. Throughout the procedure,                        the patient's blood pressure, pulse, and oxygen                        saturations were monitored continuously. The                        Colonoscope was introduced through the anus and                        advanced to the the cecum, identified by appendiceal                        orifice and ileocecal valve. The colonoscopy was                        performed without difficulty. The patient tolerated the                        procedure well. The quality of the bowel preparation                        was good except the ascending colon was poor. Findings:      Two sessile polyps were found in the transverse colon, proximal       transverse colon and distal transverse colon. The polyps were 2 to 4 mm       in size. These polyps were removed with a cold snare. Resection and       retrieval were complete.      The retroflexed view of the distal rectum and anal verge was normal and       showed no anal or rectal abnormalities.      The exam was otherwise without abnormality.  The  digital rectal exam was normal.      A large amount of solid stool was found in the ascending colon and in       the cecum, precluding visualization. Impression:           - Two 2 to 4 mm polyps in the transverse colon, in the                        proximal transverse colon and in the distal transverse                        colon, removed with a cold snare. Resected and                        retrieved.                       - The distal rectum and anal verge are normal on                        retroflexion view.                       - The examination was otherwise normal. Recommendation:       - Discharge patient to home.                       - Telephone GI clinic for pathology results in 1 week.                       - Repeat colonoscopy in 2 years for screening purposes. Procedure Code(s):    --- Professional ---                       [phone], Colonoscopy, flexible; with removal of tumor(s),                        polyp(s), or other lesion(s) by snare technique Diagnosis Code(s):    --- Professional ---                       Z12.11, Encounter for screening for malignant neoplasm                        of colon                       D12.3, Benign neoplasm of transverse colon (hepatic                        flexure or splenic flexure) CPT copyright 2016 American Medical Association. All rights reserved. The codes documented in this report are preliminary and upon coder review may  be revised to meet current compliance requirements. Lollie Sails, MD 10/19/2017 11:29:35 AM This report has been signed electronically. Number of Addenda: 0 Note Initiated On: 10/19/2017 10:50 AM Scope Withdrawal Time: 0 hours 21 minutes 6 seconds  Total Procedure Duration: 0 hours 31 minutes 41 seconds       [facility]

## 2017-10-19 NOTE — Anesthesia Preprocedure Evaluation (Signed)
Anesthesia Evaluation  Patient identified by MRN, date of birth, ID band Patient awake    Reviewed: Allergy & Precautions, H&P , NPO status , Patient's Chart, lab work & pertinent test results, reviewed documented beta blocker date and time   History of Anesthesia Complications Negative for: history of anesthetic complications  Airway Mallampati: II  TM Distance: >3 FB Neck ROM: full    Dental  (+) Dental Advidsory Given, Missing, Teeth Intact, Loose   Pulmonary neg pulmonary ROS,           Cardiovascular Exercise Tolerance: Good hypertension, (-) CAD, (-) Past MI, (-) Cardiac Stents and (-) CABG (-) dysrhythmias (-) Valvular Problems/Murmurs     Neuro/Psych negative neurological ROS  negative psych ROS   GI/Hepatic negative GI ROS, Neg liver ROS,   Endo/Other  diabetes  Renal/GU negative Renal ROS  negative genitourinary   Musculoskeletal   Abdominal   Peds  Hematology negative hematology ROS (+)   Anesthesia Other Findings Past Medical History: No date: Diabetes mellitus without complication (HCC) No date: Elevated cholesterol No date: Headache No date: Hypertension No date: Sleep disorder No date: Visual disturbances   Reproductive/Obstetrics negative OB ROS                             Anesthesia Physical Anesthesia Plan  ASA: II  Anesthesia Plan: General   Post-op Pain Management:    Induction: Intravenous  PONV Risk Score and Plan: 3 and Propofol infusion  Airway Management Planned: Natural Airway and Nasal Cannula  Additional Equipment:   Intra-op Plan:   Post-operative Plan:   Informed Consent: I have reviewed the patients History and Physical, chart, labs and discussed the procedure including the risks, benefits and alternatives for the proposed anesthesia with the patient or authorized representative who has indicated his/her understanding and acceptance.    Dental Advisory Given  Plan Discussed with: Anesthesiologist, CRNA and Surgeon  Anesthesia Plan Comments:         Anesthesia Quick Evaluation

## 2017-10-22 ENCOUNTER — Encounter: Payer: Self-pay | Admitting: Gastroenterology

## 2017-10-22 LAB — SURGICAL PATHOLOGY

## 2017-10-26 DIAGNOSIS — E113393 Type 2 diabetes mellitus with moderate nonproliferative diabetic retinopathy without macular edema, bilateral: Secondary | ICD-10-CM | POA: Diagnosis not present

## 2017-10-26 DIAGNOSIS — H35033 Hypertensive retinopathy, bilateral: Secondary | ICD-10-CM | POA: Diagnosis not present

## 2017-12-12 ENCOUNTER — Other Ambulatory Visit: Payer: Self-pay | Admitting: Internal Medicine

## 2017-12-12 ENCOUNTER — Ambulatory Visit
Admission: RE | Admit: 2017-12-12 | Discharge: 2017-12-12 | Disposition: A | Discharge status: Home / Self Care | Payer: Commercial Managed Care - PPO | Source: Ambulatory Visit | Attending: Internal Medicine | Admitting: Internal Medicine

## 2017-12-12 DIAGNOSIS — D649 Anemia, unspecified: Secondary | ICD-10-CM | POA: Diagnosis not present

## 2017-12-12 DIAGNOSIS — M7989 Other specified soft tissue disorders: Secondary | ICD-10-CM | POA: Insufficient documentation

## 2017-12-12 DIAGNOSIS — M25561 Pain in right knee: Secondary | ICD-10-CM | POA: Diagnosis not present

## 2017-12-12 DIAGNOSIS — I10 Essential (primary) hypertension: Secondary | ICD-10-CM | POA: Diagnosis not present

## 2017-12-12 DIAGNOSIS — M21611 Bunion of right foot: Secondary | ICD-10-CM | POA: Diagnosis not present

## 2017-12-12 DIAGNOSIS — R600 Localized edema: Secondary | ICD-10-CM | POA: Diagnosis not present

## 2017-12-12 DIAGNOSIS — E119 Type 2 diabetes mellitus without complications: Secondary | ICD-10-CM | POA: Diagnosis not present

## 2017-12-12 DIAGNOSIS — M25461 Effusion, right knee: Secondary | ICD-10-CM

## 2018-01-01 DIAGNOSIS — H34232 Retinal artery branch occlusion, left eye: Secondary | ICD-10-CM | POA: Diagnosis not present

## 2018-01-17 DIAGNOSIS — M205X1 Other deformities of toe(s) (acquired), right foot: Secondary | ICD-10-CM | POA: Diagnosis not present

## 2018-01-17 DIAGNOSIS — M79671 Pain in right foot: Secondary | ICD-10-CM | POA: Diagnosis not present

## 2018-01-17 DIAGNOSIS — E119 Type 2 diabetes mellitus without complications: Secondary | ICD-10-CM | POA: Diagnosis not present

## 2018-04-09 DIAGNOSIS — E119 Type 2 diabetes mellitus without complications: Secondary | ICD-10-CM | POA: Diagnosis not present

## 2018-04-09 DIAGNOSIS — M25561 Pain in right knee: Secondary | ICD-10-CM | POA: Diagnosis not present

## 2018-04-09 DIAGNOSIS — M21611 Bunion of right foot: Secondary | ICD-10-CM | POA: Diagnosis not present

## 2018-04-09 DIAGNOSIS — I10 Essential (primary) hypertension: Secondary | ICD-10-CM | POA: Diagnosis not present

## 2018-04-09 DIAGNOSIS — M7989 Other specified soft tissue disorders: Secondary | ICD-10-CM | POA: Diagnosis not present

## 2018-04-09 DIAGNOSIS — E7800 Pure hypercholesterolemia, unspecified: Secondary | ICD-10-CM | POA: Diagnosis not present

## 2018-04-16 DIAGNOSIS — E119 Type 2 diabetes mellitus without complications: Secondary | ICD-10-CM | POA: Diagnosis not present

## 2018-04-16 DIAGNOSIS — I10 Essential (primary) hypertension: Secondary | ICD-10-CM | POA: Diagnosis not present

## 2018-04-16 DIAGNOSIS — E7800 Pure hypercholesterolemia, unspecified: Secondary | ICD-10-CM | POA: Diagnosis not present

## 2018-06-20 ENCOUNTER — Other Ambulatory Visit: Payer: Self-pay | Admitting: Family Medicine

## 2018-06-20 ENCOUNTER — Ambulatory Visit
Admission: RE | Admit: 2018-06-20 | Discharge: 2018-06-20 | Disposition: A | Discharge status: Home / Self Care | Payer: Commercial Managed Care - PPO | Source: Ambulatory Visit | Attending: Family Medicine | Admitting: Family Medicine

## 2018-06-20 DIAGNOSIS — R110 Nausea: Secondary | ICD-10-CM | POA: Diagnosis not present

## 2018-06-20 DIAGNOSIS — R1032 Left lower quadrant pain: Secondary | ICD-10-CM | POA: Insufficient documentation

## 2018-06-20 DIAGNOSIS — Z9049 Acquired absence of other specified parts of digestive tract: Secondary | ICD-10-CM | POA: Diagnosis not present

## 2018-06-20 DIAGNOSIS — I700 Atherosclerosis of aorta: Secondary | ICD-10-CM | POA: Insufficient documentation

## 2018-06-20 DIAGNOSIS — R1110 Vomiting, unspecified: Secondary | ICD-10-CM | POA: Diagnosis not present

## 2018-06-20 DIAGNOSIS — M4317 Spondylolisthesis, lumbosacral region: Secondary | ICD-10-CM | POA: Insufficient documentation

## 2018-06-20 DIAGNOSIS — K838 Other specified diseases of biliary tract: Secondary | ICD-10-CM | POA: Insufficient documentation

## 2018-06-20 DIAGNOSIS — I10 Essential (primary) hypertension: Secondary | ICD-10-CM | POA: Diagnosis not present

## 2018-06-20 DIAGNOSIS — R197 Diarrhea, unspecified: Secondary | ICD-10-CM | POA: Diagnosis not present

## 2018-06-20 LAB — POCT I-STAT CREATININE: CREATININE: 0.9 mg/dL (ref 0.44–1.00)

## 2018-06-20 MED ORDER — IOPAMIDOL (ISOVUE-300) INJECTION 61%
85.0000 mL | Freq: Once | INTRAVENOUS | Status: AC | PRN
  Administered 2018-06-20: 85 mL via INTRAVENOUS

## 2018-08-22 DIAGNOSIS — I10 Essential (primary) hypertension: Secondary | ICD-10-CM | POA: Diagnosis not present

## 2018-08-22 DIAGNOSIS — E119 Type 2 diabetes mellitus without complications: Secondary | ICD-10-CM | POA: Diagnosis not present

## 2018-08-22 DIAGNOSIS — E1165 Type 2 diabetes mellitus with hyperglycemia: Secondary | ICD-10-CM | POA: Diagnosis not present

## 2018-10-15 ENCOUNTER — Other Ambulatory Visit: Payer: Self-pay

## 2018-10-15 ENCOUNTER — Emergency Department: Payer: Commercial Managed Care - PPO

## 2018-10-15 ENCOUNTER — Emergency Department
Admission: EM | Admit: 2018-10-15 | Discharge: 2018-10-15 | Disposition: A | Discharge status: Home / Self Care | Payer: Commercial Managed Care - PPO | Attending: Emergency Medicine | Admitting: Emergency Medicine

## 2018-10-15 DIAGNOSIS — I10 Essential (primary) hypertension: Secondary | ICD-10-CM | POA: Diagnosis not present

## 2018-10-15 DIAGNOSIS — Z79899 Other long term (current) drug therapy: Secondary | ICD-10-CM | POA: Insufficient documentation

## 2018-10-15 DIAGNOSIS — M25561 Pain in right knee: Secondary | ICD-10-CM | POA: Insufficient documentation

## 2018-10-15 DIAGNOSIS — E119 Type 2 diabetes mellitus without complications: Secondary | ICD-10-CM | POA: Diagnosis not present

## 2018-10-15 DIAGNOSIS — W010XXA Fall on same level from slipping, tripping and stumbling without subsequent striking against object, initial encounter: Secondary | ICD-10-CM | POA: Insufficient documentation

## 2018-10-15 DIAGNOSIS — W19XXXA Unspecified fall, initial encounter: Secondary | ICD-10-CM

## 2018-10-15 DIAGNOSIS — Z7984 Long term (current) use of oral hypoglycemic drugs: Secondary | ICD-10-CM | POA: Insufficient documentation

## 2018-10-15 DIAGNOSIS — M25571 Pain in right ankle and joints of right foot: Secondary | ICD-10-CM | POA: Insufficient documentation

## 2018-10-15 MED ORDER — OXYCODONE-ACETAMINOPHEN 5-325 MG PO TABS: 1.0000 | ORAL_TABLET | Freq: Three times a day (TID) | ORAL | 0 refills | Status: AC | PRN

## 2018-10-15 MED ORDER — OXYCODONE-ACETAMINOPHEN 5-325 MG PO TABS: 1.0000 | ORAL_TABLET | Freq: Three times a day (TID) | ORAL | 0 refills | Status: DC | PRN

## 2018-10-15 MED ORDER — ONDANSETRON 4 MG PO TBDP: 4.0000 mg | ORAL_TABLET | Freq: Three times a day (TID) | ORAL | 0 refills | Status: DC | PRN

## 2018-10-15 MED ORDER — OXYCODONE-ACETAMINOPHEN 5-325 MG PO TABS
1.0000 | ORAL_TABLET | Freq: Once | ORAL | Status: AC
  Administered 2018-10-15: 1 via ORAL
  Filled 2018-10-15: qty 1

## 2018-10-15 MED ORDER — ONDANSETRON 4 MG PO TBDP: 4.0000 mg | ORAL_TABLET | Freq: Three times a day (TID) | ORAL | 0 refills | Status: AC | PRN

## 2018-10-15 MED ORDER — ONDANSETRON 4 MG PO TBDP
4.0000 mg | ORAL_TABLET | Freq: Once | ORAL | Status: AC
  Administered 2018-10-15: 4 mg via ORAL
  Filled 2018-10-15: qty 1

## 2018-10-15 NOTE — ED Provider Notes (Signed)
[facility] Emergency Department Provider Note  ____________________________________________  Time seen: Approximately 11:03 PM  I have reviewed the triage vital signs and the nursing notes.   HISTORY  Chief Complaint Fall    HPI Denaly Emiyah Spraggins is a 63 y.o. female presents to the emergency department with acute right knee and right ankle pain.  Patient reports that she tripped and fell yesterday over her laundry basket.  Patient has had difficulty bearing weight since incident occurred.  She did not hit her head during fall.  No numbness or tingling of the right lower extremity.  No abrasions or lacerations.  No prior right ankle fractures in the past.  No alleviating measures have been attempted.        Past Medical History:  Diagnosis Date  . Diabetes mellitus without complication ([place])   . Elevated cholesterol   . Headache   . Hypertension   . Sleep disorder   . Visual disturbances     There are no active problems to display for this patient.   Past Surgical History:  Procedure Laterality Date  . CHOLECYSTECTOMY    . COLONOSCOPY WITH PROPOFOL N/A 10/19/2017   Procedure: COLONOSCOPY WITH PROPOFOL;  Surgeon: Lollie Sails, MD;  Location: [facility] ENDOSCOPY;  Service: Endoscopy;  Laterality: N/A;    Prior to Admission medications   Medication Sig Start Date End Date Taking? Authorizing Provider  amLODipine-benazepril (LOTREL) 5-10 MG capsule Take 1 capsule by mouth daily. 01/25/17   [provider]  atorvastatin (LIPITOR) 20 MG tablet Take 20 mg by mouth daily. 11/18/16   [provider]  cyclobenzaprine (FLEXERIL) 10 MG tablet Take 10 mg by mouth daily as needed for muscle spasms.    [provider]  glipiZIDE (GLUCOTROL XL) 10 MG 24 hr tablet Take 10 mg by mouth 2 (two) times daily. 11/08/16   [provider]  JANUVIA 100 MG tablet Take 100 mg by mouth daily. 12/04/16   [provider]  liraglutide  (VICTOZA) 18 MG/3ML SOPN Inject 0.6 mg into the skin daily.    [provider]  lisinopril-hydrochlorothiazide (PRINZIDE,ZESTORETIC) 20-12.5 MG tablet Take 1 tablet by mouth daily.    [provider]  ondansetron (ZOFRAN ODT) 4 MG disintegrating tablet Take 1 tablet (4 mg total) by mouth every 8 (eight) hours as needed for up to 3 days for nausea or vomiting. 10/15/18 10/18/18  Lannie Fields, PA-C  oxyCODONE-acetaminophen (PERCOCET/ROXICET) 5-325 MG tablet Take 1 tablet by mouth every 8 (eight) hours as needed for up to 3 days for severe pain. 10/15/18 10/18/18  Lannie Fields, PA-C    Allergies Patient has no known allergies.  Family History  Problem Relation Age of Onset  . Breast cancer Neg Hx     Social History Social History   Tobacco Use  . Smoking status: Never Smoker  . Smokeless tobacco: Never Used  Substance Use Topics  . Alcohol use: No    Frequency: Never  . Drug use: No     Review of Systems  Constitutional: No fever/chills Eyes: No visual changes. No discharge ENT: No upper respiratory complaints. Cardiovascular: no chest pain. Respiratory: no cough. No SOB. Gastrointestinal: No abdominal pain.  No nausea, no vomiting.  No diarrhea.  No constipation. Musculoskeletal: Patient has right knee, right ankle and right foot pain.  Skin: Negative for rash, abrasions, lacerations, ecchymosis. Neurological: Negative for headaches, focal weakness or numbness.  ____________________________________________   PHYSICAL EXAM:  VITAL SIGNS: ED Triage  Vitals [10/15/18 2222]  Enc Vitals Group     BP (!) 184/85     Pulse Rate 94     Resp 20     Temp 98.1 F (36.7 C)     Temp Source Oral     SpO2 99 %     Weight 120 lb (54.4 kg)     Height      Head Circumference      Peak Flow      Pain Score 9     Pain Loc      Pain Edu?      Excl. in [place]?      Constitutional: Alert and oriented. Well appearing and in no acute distress. Eyes: Conjunctivae  are normal. PERRL. EOMI. Head: Atraumatic. ENT:      Ears: TMs are pearly.       Nose: No congestion/rhinnorhea.      Mouth/Throat: Mucous membranes are moist.  Neck: No stridor.  No cervical spine tenderness to palpation.  Cardiovascular: Normal rate, regular rhythm. Normal S1 and S2.  Good peripheral circulation. Respiratory: Normal respiratory effort without tachypnea or retractions. Lungs CTAB. Good air entry to the bases with no decreased or absent breath sounds. Gastrointestinal: Bowel sounds 4 quadrants. Soft and nontender to palpation. No guarding or rigidity. No palpable masses. No distention. No CVA tenderness. Musculoskeletal: Patient is unable to perform full range of motion at the right ankle, likely secondary to pain.  Patient can perform full range of motion at the right knee.  Patient has ecchymosis over right lateral ankle.  Patient has tenderness with palpation over the right lateral malleolus.  Palpable dorsalis pedis pulse bilaterally and symmetrically. Neurologic:  Normal speech and language. No gross focal neurologic deficits are appreciated.  Skin:  Skin is warm, dry and intact. No rash noted. Psychiatric: Mood and affect are normal. Speech and behavior are normal. Patient exhibits appropriate insight and judgement.   ____________________________________________   LABS (all labs ordered are listed, but only abnormal results are displayed)  Labs Reviewed - No data to display ____________________________________________  EKG   ____________________________________________  RADIOLOGY I personally viewed and evaluated these images as part of my medical decision making, as well as reviewing the written report by the radiologist.  Dg Ankle Complete Right  Result Date: 10/15/2018 CLINICAL DATA:  Fall, right foot and ankle pain. EXAM: RIGHT ANKLE - COMPLETE 3+ VIEW COMPARISON:  None FINDINGS: There is an oblique fracture through the distal right fibula, mildly  displaced. No tibial abnormality. Overlying soft tissue swelling. Ankle mortise is intact. IMPRESSION: Mildly displaced distal right fibular fracture. Electronically Signed   By: Rolm Baptise M.D.   On: 10/15/2018 22:45   Dg Knee Complete 4 Views Right  Result Date: 10/15/2018 CLINICAL DATA:  Fall, right knee pain EXAM: RIGHT KNEE - COMPLETE 4+ VIEW COMPARISON:  None. FINDINGS: No evidence of fracture, dislocation, or joint effusion. No evidence of arthropathy or other focal bone abnormality. Soft tissues are unremarkable. IMPRESSION: Negative. Electronically Signed   By: Rolm Baptise M.D.   On: 10/15/2018 22:44   Dg Foot Complete Right  Result Date: 10/15/2018 CLINICAL DATA:  Fall, foot and ankle pain EXAM: RIGHT FOOT COMPLETE - 3+ VIEW COMPARISON:  Ankle series performed today. FINDINGS: Distal fibular fracture again noted as seen on ankle series. No additional acute bony abnormality within the foot. No fracture, subluxation or dislocation. Degenerative changes at the 1st MTP joint with joint space narrowing and spurring. Soft tissue swelling along  the dorsum of the foot. IMPRESSION: Distal right fibular fracture as seen on ankle series. No additional acute bony abnormality within the foot. Electronically Signed   By: Rolm Baptise M.D.   On: 10/15/2018 22:46    ____________________________________________    PROCEDURES  Procedure(s) performed:    Procedures    Medications  oxyCODONE-acetaminophen (PERCOCET/ROXICET) 5-325 MG per tablet 1 tablet (1 tablet Oral Given 10/15/18 2300)  ondansetron (ZOFRAN-ODT) disintegrating tablet 4 mg (4 mg Oral Given 10/15/18 2300)     ____________________________________________   INITIAL IMPRESSION / ASSESSMENT AND PLAN / ED COURSE  Pertinent labs & imaging results that were available during my care of the patient were reviewed by me and considered in my medical decision making (see chart for details).  Review of the [place] CSRS was performed in  accordance of the [place] prior to dispensing any controlled drugs.           Assessment and plan Fall Patient presents to the emergency department with acute right ankle pain after a fall that occurred yesterday.  X-ray examination is concerning for a lateral malleolus fracture.  Patient was placed in a posterior short leg splint and crutches were provided.  Roxicet was given for pain.  Patient was neurovascularly intact prior to discharge.  She was advised to follow-up with Dr. Posey Pronto.  All patient questions were answered.    ____________________________________________  FINAL CLINICAL IMPRESSION(S) / ED DIAGNOSES  Final diagnoses:  Fall, initial encounter      NEW MEDICATIONS STARTED DURING THIS VISIT:  ED Discharge Orders         Ordered    oxyCODONE-acetaminophen (PERCOCET/ROXICET) 5-325 MG tablet  Every 8 hours PRN,   Status:  Discontinued     10/15/18 2255    ondansetron (ZOFRAN ODT) 4 MG disintegrating tablet  Every 8 hours PRN,   Status:  Discontinued     10/15/18 2255    oxyCODONE-acetaminophen (PERCOCET/ROXICET) 5-325 MG tablet  Every 8 hours PRN     10/15/18 2256    ondansetron (ZOFRAN ODT) 4 MG disintegrating tablet  Every 8 hours PRN     10/15/18 2256              This chart was dictated using voice recognition software/Dragon. Despite best efforts to proofread, errors can occur which can change the meaning. Any change was purely unintentional.    Karren Cobble 10/15/18 2307    Nance Pear, MD 10/15/18 [phone]

## 2018-10-15 NOTE — ED Triage Notes (Signed)
Pt was folding clothes yesterday and tripped on laundry basket. Pt is now co right knee pain and right foot and ankle pain.

## 2018-10-17 DIAGNOSIS — R6889 Other general symptoms and signs: Secondary | ICD-10-CM | POA: Diagnosis not present

## 2018-10-17 DIAGNOSIS — S82831A Other fracture of upper and lower end of right fibula, initial encounter for closed fracture: Secondary | ICD-10-CM | POA: Diagnosis not present

## 2018-10-30 DIAGNOSIS — M25571 Pain in right ankle and joints of right foot: Secondary | ICD-10-CM | POA: Diagnosis not present

## 2018-10-30 DIAGNOSIS — S82831D Other fracture of upper and lower end of right fibula, subsequent encounter for closed fracture with routine healing: Secondary | ICD-10-CM | POA: Diagnosis not present

## 2018-11-13 DIAGNOSIS — M79671 Pain in right foot: Secondary | ICD-10-CM | POA: Diagnosis not present

## 2018-11-13 DIAGNOSIS — S82831D Other fracture of upper and lower end of right fibula, subsequent encounter for closed fracture with routine healing: Secondary | ICD-10-CM | POA: Diagnosis not present

## 2018-12-05 DIAGNOSIS — S82831D Other fracture of upper and lower end of right fibula, subsequent encounter for closed fracture with routine healing: Secondary | ICD-10-CM | POA: Diagnosis not present

## 2018-12-24 ENCOUNTER — Other Ambulatory Visit: Payer: Self-pay | Admitting: Orthopedic Surgery

## 2018-12-24 DIAGNOSIS — M899 Disorder of bone, unspecified: Secondary | ICD-10-CM

## 2018-12-24 DIAGNOSIS — M25561 Pain in right knee: Secondary | ICD-10-CM

## 2018-12-26 ENCOUNTER — Other Ambulatory Visit: Payer: Self-pay | Admitting: Internal Medicine

## 2018-12-26 DIAGNOSIS — Z1231 Encounter for screening mammogram for malignant neoplasm of breast: Secondary | ICD-10-CM

## 2019-01-09 ENCOUNTER — Ambulatory Visit: Admission: RE | Admit: 2019-01-09 | Payer: Commercial Managed Care - PPO | Source: Ambulatory Visit

## 2019-02-13 ENCOUNTER — Other Ambulatory Visit: Payer: Self-pay

## 2019-02-13 DIAGNOSIS — Z20822 Contact with and (suspected) exposure to covid-19: Secondary | ICD-10-CM

## 2019-02-18 LAB — NOVEL CORONAVIRUS, NAA: SARS-CoV-2, NAA: NOT DETECTED

## 2019-02-21 ENCOUNTER — Emergency Department
Admission: EM | Admit: 2019-02-21 | Discharge: 2019-02-21 | Disposition: A | Discharge status: Home / Self Care | Attending: Emergency Medicine | Admitting: Emergency Medicine

## 2019-02-21 ENCOUNTER — Other Ambulatory Visit: Payer: Self-pay

## 2019-02-21 ENCOUNTER — Encounter: Payer: Self-pay | Admitting: Emergency Medicine

## 2019-02-21 DIAGNOSIS — W268XXA Contact with other sharp object(s), not elsewhere classified, initial encounter: Secondary | ICD-10-CM | POA: Insufficient documentation

## 2019-02-21 DIAGNOSIS — Z7984 Long term (current) use of oral hypoglycemic drugs: Secondary | ICD-10-CM | POA: Insufficient documentation

## 2019-02-21 DIAGNOSIS — Y990 Civilian activity done for income or pay: Secondary | ICD-10-CM | POA: Insufficient documentation

## 2019-02-21 DIAGNOSIS — I10 Essential (primary) hypertension: Secondary | ICD-10-CM | POA: Insufficient documentation

## 2019-02-21 DIAGNOSIS — S61309A Unspecified open wound of unspecified finger with damage to nail, initial encounter: Secondary | ICD-10-CM

## 2019-02-21 DIAGNOSIS — Z79899 Other long term (current) drug therapy: Secondary | ICD-10-CM | POA: Insufficient documentation

## 2019-02-21 DIAGNOSIS — E119 Type 2 diabetes mellitus without complications: Secondary | ICD-10-CM | POA: Insufficient documentation

## 2019-02-21 DIAGNOSIS — S61304A Unspecified open wound of right ring finger with damage to nail, initial encounter: Secondary | ICD-10-CM | POA: Diagnosis not present

## 2019-02-21 DIAGNOSIS — Y939 Activity, unspecified: Secondary | ICD-10-CM | POA: Diagnosis not present

## 2019-02-21 DIAGNOSIS — Y929 Unspecified place or not applicable: Secondary | ICD-10-CM | POA: Diagnosis not present

## 2019-02-21 DIAGNOSIS — S6991XA Unspecified injury of right wrist, hand and finger(s), initial encounter: Secondary | ICD-10-CM | POA: Diagnosis present

## 2019-02-21 MED ORDER — LIDOCAINE-EPINEPHRINE-TETRACAINE (LET) SOLUTION
3.0000 mL | Freq: Once | NASAL | Status: AC
  Administered 2019-02-21: 3 mL via TOPICAL
  Filled 2019-02-21: qty 3

## 2019-02-21 MED ORDER — LIDOCAINE HCL (PF) 1 % IJ SOLN
INTRAMUSCULAR | Status: AC
  Administered 2019-02-21: 5 mL
  Filled 2019-02-21: qty 5

## 2019-02-21 MED ORDER — NEOSPORIN PLUS PAIN RELIEF MS 3.5-10000-10 EX CREA: TOPICAL_CREAM | Freq: Two times a day (BID) | CUTANEOUS | 0 refills | Status: DC

## 2019-02-21 MED ORDER — LIDOCAINE HCL (PF) 1 % IJ SOLN
5.0000 mL | Freq: Once | INTRAMUSCULAR | Status: AC
  Administered 2019-02-21: 08:00:00 5 mL

## 2019-02-21 NOTE — ED Triage Notes (Signed)
Pt to ED via POV for laceration to the right hand. Pt is in NAD.

## 2019-02-21 NOTE — ED Provider Notes (Addendum)
[facility] Emergency Department Provider Note   ____________________________________________   First MD Initiated Contact with Patient 02/21/19 0740     (approximate)  I have reviewed the triage vital signs and the nursing notes.   HISTORY Via interpreter Chief Complaint Laceration    HPI Michele Sanford is a 63 y.o. female patient presents with complete nail avulsion of the fourth digit right hand.  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  No laceration to the affected digit.  Patient denies loss sensation or loss of function.  Bleeding is controlled with direct pressure.  Patient rates pain as a 7/10.  Patient described the pain is "achy".         Past Medical History:  Diagnosis Date  . Diabetes mellitus without complication ([place])   . Elevated cholesterol   . Headache   . Hypertension   . Sleep disorder   . Visual disturbances     There are no active problems to display for this patient.   Past Surgical History:  Procedure Laterality Date  . CHOLECYSTECTOMY    . COLONOSCOPY WITH PROPOFOL N/A 10/19/2017   Procedure: COLONOSCOPY WITH PROPOFOL;  Surgeon: Lollie Sails, MD;  Location: [facility] ENDOSCOPY;  Service: Endoscopy;  Laterality: N/A;    Prior to Admission medications   Medication Sig Start Date End Date Taking? Authorizing Provider  amLODipine-benazepril (LOTREL) 5-10 MG capsule Take 1 capsule by mouth daily. 01/25/17   [provider]  atorvastatin (LIPITOR) 20 MG tablet Take 20 mg by mouth daily. 11/18/16   [provider]  cyclobenzaprine (FLEXERIL) 10 MG tablet Take 10 mg by mouth daily as needed for muscle spasms.    [provider]  glipiZIDE (GLUCOTROL XL) 10 MG 24 hr tablet Take 10 mg by mouth 2 (two) times daily. 11/08/16   [provider]  JANUVIA 100 MG tablet Take 100 mg by mouth daily. 12/04/16   [provider]  liraglutide (VICTOZA) 18 MG/3ML SOPN  Inject 0.6 mg into the skin daily.    [provider]  lisinopril-hydrochlorothiazide (PRINZIDE,ZESTORETIC) 20-12.5 MG tablet Take 1 tablet by mouth daily.    [provider]  neomycin-polymyxin-pramoxine (NEOSPORIN PLUS) 1 % cream Apply topically 2 (two) times daily. 02/21/19   Sable Feil, PA-C    Allergies Patient has no known allergies.  Family History  Problem Relation Age of Onset  . Breast cancer Neg Hx     Social History Social History   Tobacco Use  . Smoking status: Never Smoker  . Smokeless tobacco: Never Used  Substance Use Topics  . Alcohol use: No    Frequency: Never  . Drug use: No    Review of Systems  Constitutional: No fever/chills Eyes: No visual changes. ENT: No sore throat. Cardiovascular: Denies chest pain. Respiratory: Denies shortness of breath. Gastrointestinal: No abdominal pain.  No nausea, no vomiting.  No diarrhea.  No constipation. Genitourinary: Negative for dysuria. Musculoskeletal: Negative for back pain. Skin: Negative for rash. Neurological: Negative for headaches, focal weakness or numbness. Endocrine:  Diabetes, hyperlipidemia, hypertension. ____________________________________________   PHYSICAL EXAM:  VITAL SIGNS: ED Triage Vitals  Enc Vitals Group     BP 02/21/19 0734 (!) 187/76     Pulse Rate 02/21/19 0734 77     Resp 02/21/19 0734 16     Temp 02/21/19 0734 99.1 F (37.3 C)     Temp Source 02/21/19 0734 Oral     SpO2 02/21/19 0734 100 %  Weight --      Height --      Head Circumference --      Peak Flow --      Pain Score 02/21/19 0728 8     Pain Loc --      Pain Edu? --      Excl. in [place]? --     Constitutional: Alert and oriented. Well appearing and in no acute distress. Cardiovascular: Normal rate, regular rhythm. Grossly normal heart sounds.  Good peripheral circulation. Respiratory: Normal respiratory effort.  No retractions. Lungs CTAB. Neurologic:  Normal speech and language. No  gross focal neurologic deficits are appreciated. No gait instability. Skin:  Skin is warm, dry and intact. No rash noted.  Complete nail avulsion fourth digit right hand. Psychiatric: Mood and affect are normal. Speech and behavior are normal.  ____________________________________________   LABS (all labs ordered are listed, but only abnormal results are displayed)  Labs Reviewed - No data to display ____________________________________________  EKG   ____________________________________________  RADIOLOGY  ED MD interpretation:    Official radiology report(s): No results found.  ____________________________________________   PROCEDURES  Procedure(s) performed (including Critical Care):  Procedures   ____________________________________________   INITIAL IMPRESSION / ASSESSMENT AND PLAN / ED COURSE  As part of my medical decision making, I reviewed the following data within the [place] was evaluated in Emergency Department on 02/21/2019 for the symptoms described in the history of present illness. She was evaluated in the context of the global COVID-19 pandemic, which necessitated consideration that the patient might be at risk for infection with the SARS-CoV-2 virus that causes COVID-19. Institutional protocols and algorithms that pertain to the evaluation of patients at risk for COVID-19 are in a state of rapid change based on information released by regulatory bodies including the CDC and federal and state organizations. These policies and algorithms were followed during the patient's care in the ED.  Patient presents with total nail avulsion on the fourth digit right hand.  No further damage etc. mild bleeding from nail bed. LET and Surgicel was applied to the nailbed.  Patient given discharge care instructions and advised follow-up PCP for continued care.      ____________________________________________   FINAL  CLINICAL IMPRESSION(S) / ED DIAGNOSES  Final diagnoses:  Fingernail avulsion, complete, initial encounter     ED Discharge Orders         Ordered    neomycin-polymyxin-pramoxine (NEOSPORIN PLUS) 1 % cream  2 times daily     02/21/19 0815           Note:  This document was prepared using Dragon voice recognition software and may include unintentional dictation errors.    Sable Feil, PA-C 02/21/19 0820    Sable Feil, PA-C 02/21/19 0932    Vanessa Valle, MD 02/21/19 [phone]

## 2019-04-28 ENCOUNTER — Other Ambulatory Visit: Payer: Self-pay | Admitting: Internal Medicine

## 2019-04-28 DIAGNOSIS — Z1231 Encounter for screening mammogram for malignant neoplasm of breast: Secondary | ICD-10-CM

## 2019-07-26 IMAGING — MG MM DIGITAL SCREENING BILAT W/ CAD
5 series · 5 of 5 positions shown · non-contrast
Comparison: None.

CLINICAL DATA: Screening.

EXAM:
DIGITAL SCREENING BILATERAL MAMMOGRAM WITH CAD

[L CC (1 of 2)]
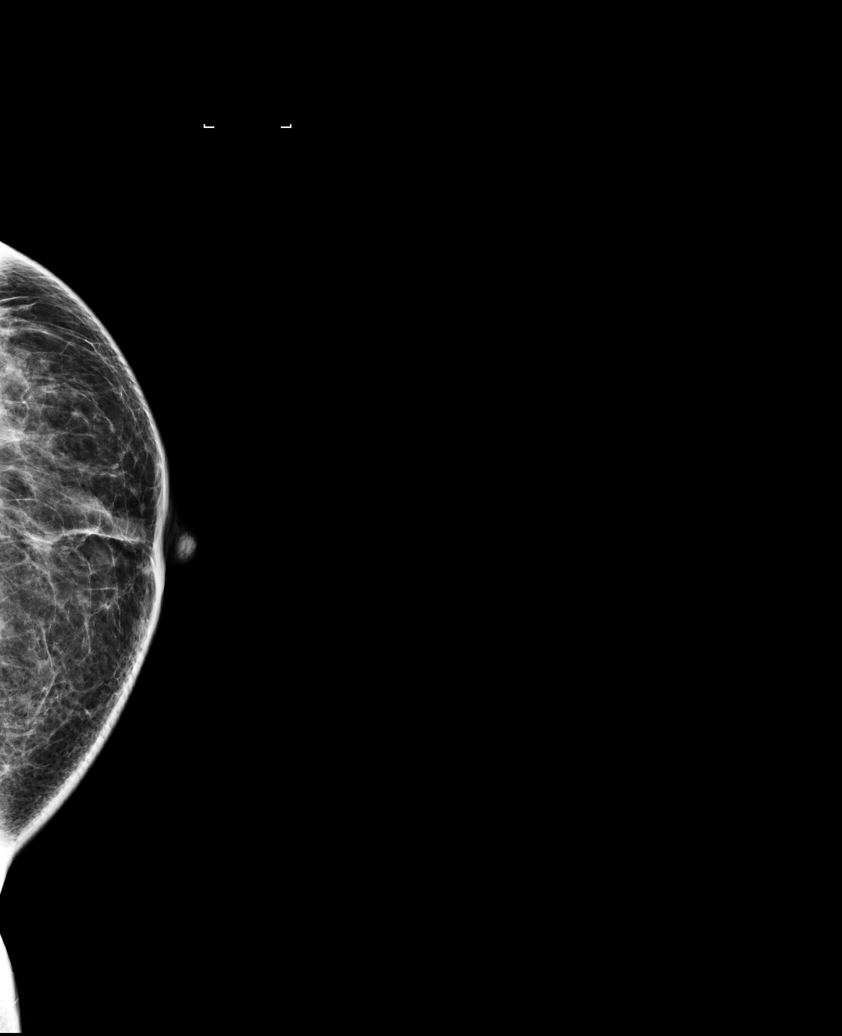

[L CC (2 of 2)]
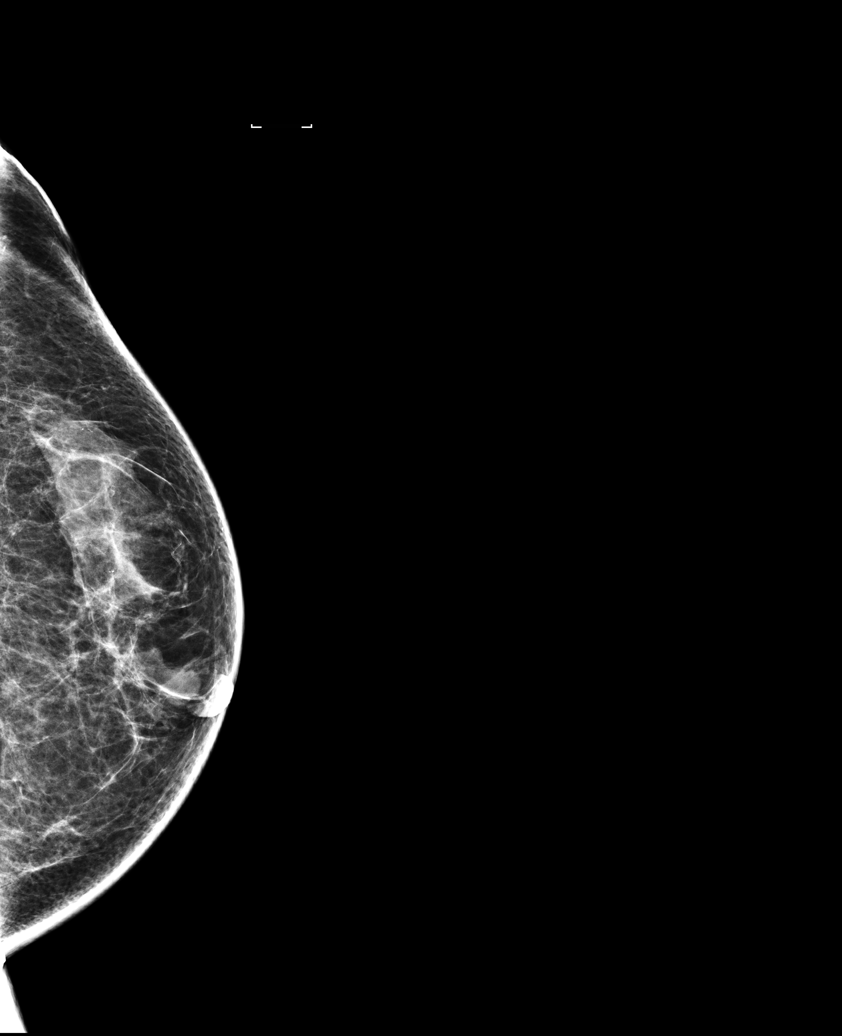

[R CC]
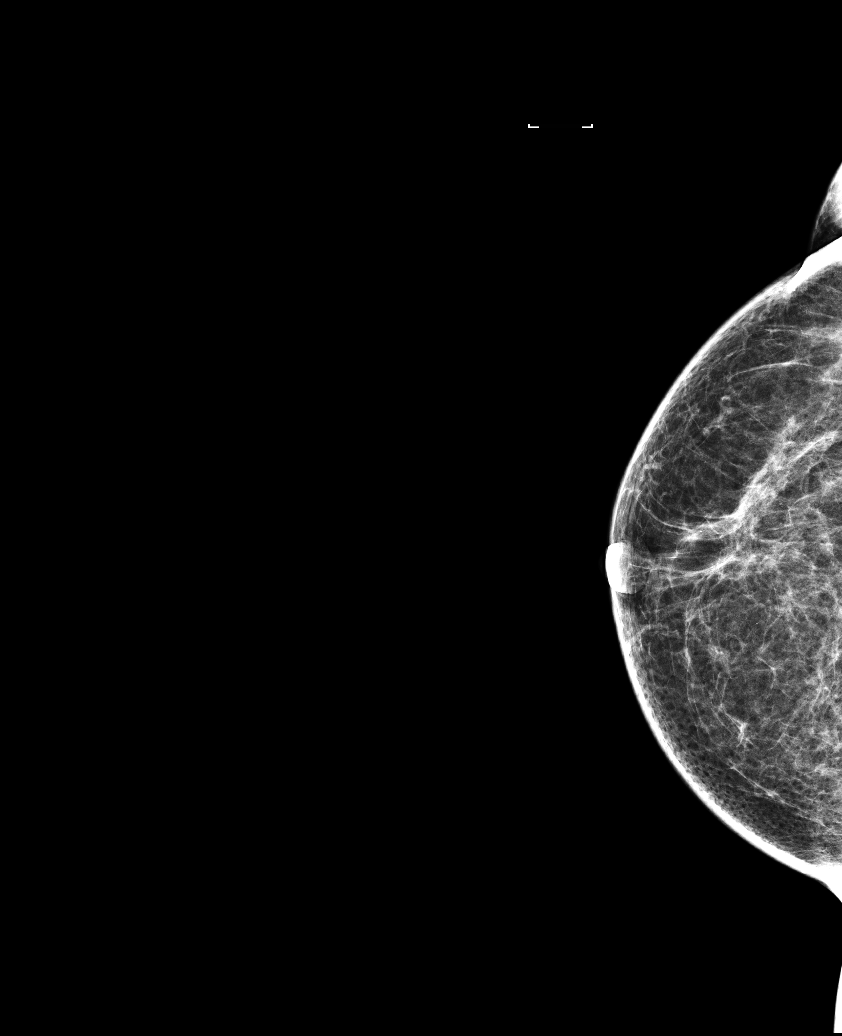

[L MLO]
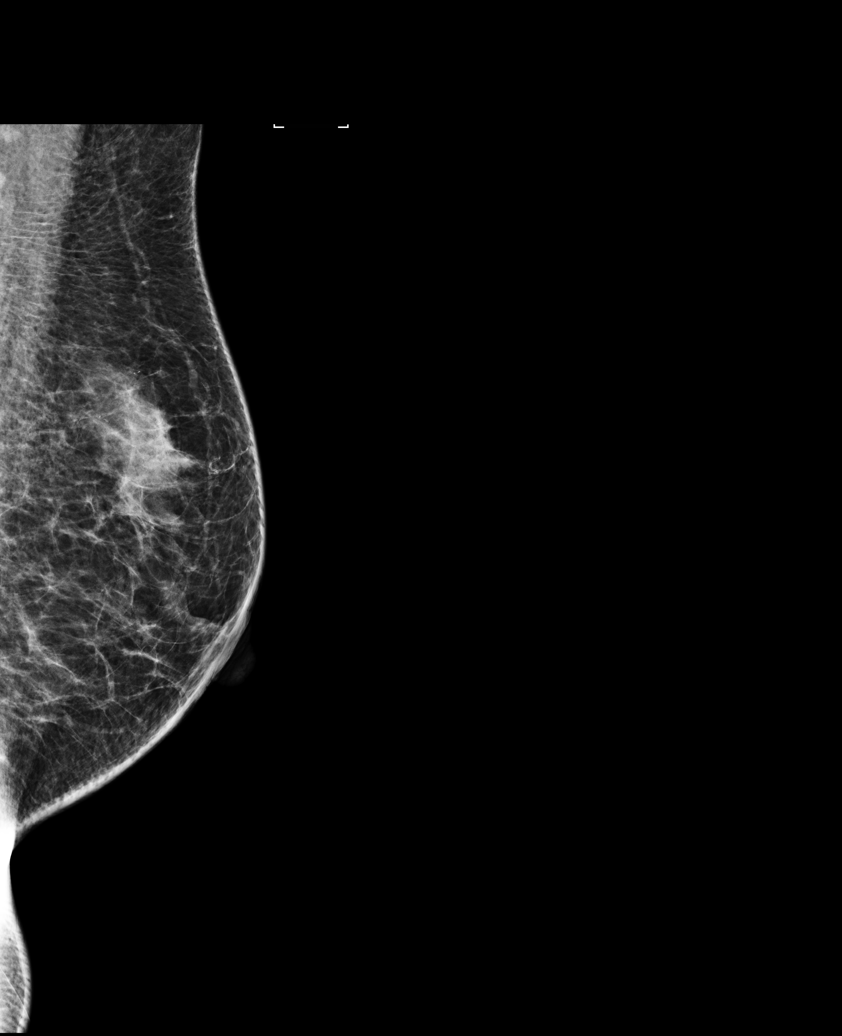

[R MLO]
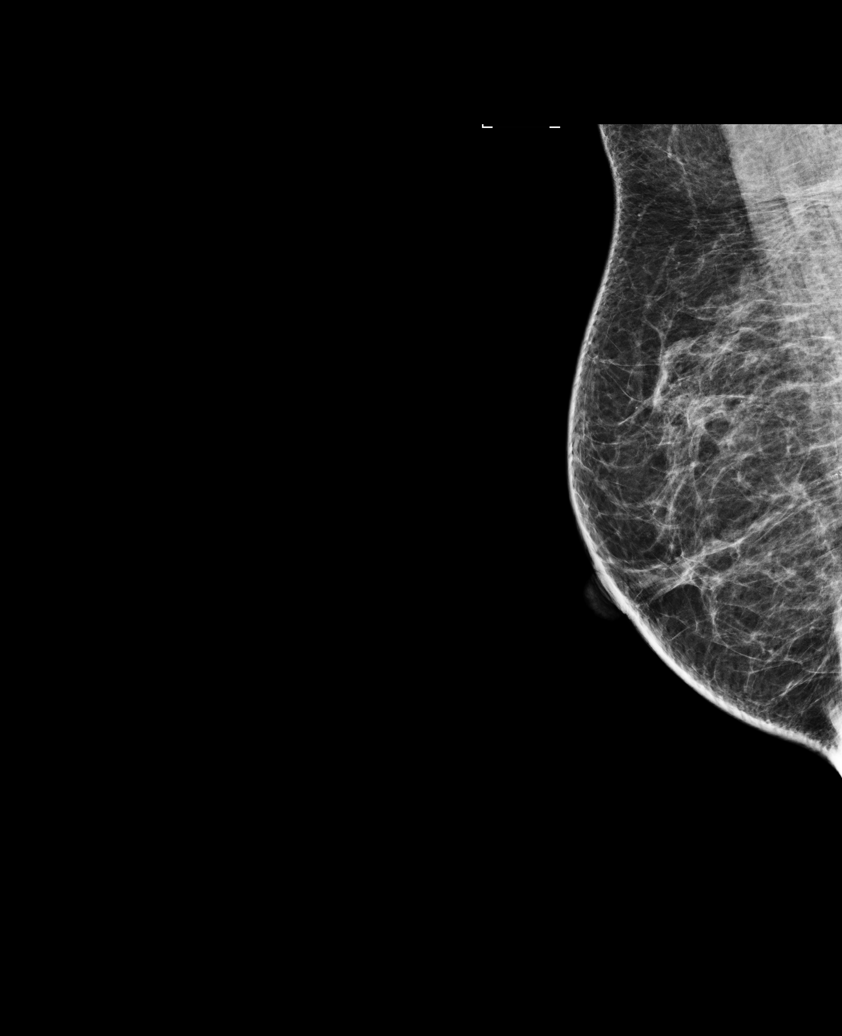

[5 of 5 positions shown; findings below may reference images not displayed]

ACR Breast Density Category b: There are scattered areas of
fibroglandular density.
FINDINGS: In the left breast, a possible asymmetry warrants further
evaluation. In the right breast, no findings suspicious for
malignancy. Images were processed with CAD.
IMPRESSION: Further evaluation is suggested for possible asymmetry in the left
breast.

RECOMMENDATION:
Diagnostic mammogram and possibly ultrasound of the left breast.
(Code:2B-[id number])

The patient will be contacted regarding the findings, and additional
imaging will be scheduled.

BI-RADS CATEGORY  0: Incomplete. Need additional imaging evaluation
and/or prior mammograms for comparison.

## 2019-08-17 ENCOUNTER — Emergency Department
Admission: EM | Admit: 2019-08-17 | Discharge: 2019-08-17 | Disposition: A | Discharge status: Home / Self Care | Payer: Commercial Managed Care - PPO | Attending: Student in an Organized Health Care Education/Training Program | Admitting: Student in an Organized Health Care Education/Training Program

## 2019-08-17 ENCOUNTER — Encounter: Payer: Self-pay | Admitting: Emergency Medicine

## 2019-08-17 ENCOUNTER — Emergency Department: Payer: Commercial Managed Care - PPO

## 2019-08-17 ENCOUNTER — Other Ambulatory Visit: Payer: Self-pay

## 2019-08-17 DIAGNOSIS — Z20822 Contact with and (suspected) exposure to covid-19: Secondary | ICD-10-CM | POA: Diagnosis not present

## 2019-08-17 DIAGNOSIS — J181 Lobar pneumonia, unspecified organism: Secondary | ICD-10-CM | POA: Insufficient documentation

## 2019-08-17 DIAGNOSIS — R05 Cough: Secondary | ICD-10-CM

## 2019-08-17 DIAGNOSIS — E119 Type 2 diabetes mellitus without complications: Secondary | ICD-10-CM | POA: Diagnosis not present

## 2019-08-17 DIAGNOSIS — Z79899 Other long term (current) drug therapy: Secondary | ICD-10-CM | POA: Diagnosis not present

## 2019-08-17 DIAGNOSIS — I10 Essential (primary) hypertension: Secondary | ICD-10-CM | POA: Insufficient documentation

## 2019-08-17 DIAGNOSIS — R42 Dizziness and giddiness: Secondary | ICD-10-CM | POA: Diagnosis present

## 2019-08-17 DIAGNOSIS — Z7984 Long term (current) use of oral hypoglycemic drugs: Secondary | ICD-10-CM | POA: Insufficient documentation

## 2019-08-17 DIAGNOSIS — R059 Cough, unspecified: Secondary | ICD-10-CM

## 2019-08-17 DIAGNOSIS — J189 Pneumonia, unspecified organism: Secondary | ICD-10-CM

## 2019-08-17 LAB — CBC
HCT: 39.6 % (ref 36.0–46.0)
Hemoglobin: 13.3 g/dL (ref 12.0–15.0)
MCH: 31.1 pg (ref 26.0–34.0)
MCHC: 33.6 g/dL (ref 30.0–36.0)
MCV: 92.7 fL (ref 80.0–100.0)
Platelets: 265 10*3/uL (ref 150–400)
RBC: 4.27 MIL/uL (ref 3.87–5.11)
RDW: 12.2 % (ref 11.5–15.5)
WBC: 7 10*3/uL (ref 4.0–10.5)
nRBC: 0 % (ref 0.0–0.2)

## 2019-08-17 LAB — BASIC METABOLIC PANEL
Anion gap: 9 (ref 5–15)
BUN: 18 mg/dL (ref 8–23)
CO2: 24 mmol/L (ref 22–32)
Calcium: 8.7 mg/dL — ABNORMAL LOW (ref 8.9–10.3)
Chloride: 103 mmol/L (ref 98–111)
Creatinine, Ser: 1.26 mg/dL — ABNORMAL HIGH (ref 0.44–1.00)
GFR calc Af Amer: 53 mL/min — ABNORMAL LOW (ref >60)
GFR calc non Af Amer: 46 mL/min — ABNORMAL LOW (ref >60)
Glucose, Bld: 106 mg/dL — ABNORMAL HIGH (ref 70–99)
Potassium: 4.1 mmol/L (ref 3.5–5.1)
Sodium: 136 mmol/L (ref 135–145)

## 2019-08-17 LAB — URINALYSIS, COMPLETE (UACMP) WITH MICROSCOPIC
Bacteria, UA: NONE SEEN
Bilirubin Urine: NEGATIVE
Glucose, UA: NEGATIVE mg/dL
Hgb urine dipstick: NEGATIVE
Ketones, ur: 5 mg/dL — AB
Leukocytes,Ua: NEGATIVE
Nitrite: NEGATIVE
Protein, ur: 100 mg/dL — AB
Specific Gravity, Urine: 1.017 (ref 1.005–1.030)
pH: 6 (ref 5.0–8.0)

## 2019-08-17 MED ORDER — AZITHROMYCIN 250 MG PO TABS: ORAL_TABLET | ORAL | 0 refills | Status: DC

## 2019-08-17 NOTE — ED Provider Notes (Addendum)
[facility] Emergency Department Provider Note  ____________________________________________   First MD Initiated Contact with Patient 08/17/19 [phone]     (approximate)  I have reviewed the triage vital signs and the nursing notes.   HISTORY  Chief Complaint Dizziness Per Spanish interpreter.  HPI Michele Sanford is a 64 y.o. female presents to the ED with complaint of feeling lightheaded.  Patient also complains of decreased appetite.  Patient states that she had a Covid test [place] [facility] but has not received the results yet.  She denies any fever but does have a productive cough.  When asked about family member she states that her husband is at home with same symptoms and was told he has Covid.       Past Medical History:  Diagnosis Date  . Diabetes mellitus without complication ([place])   . Elevated cholesterol   . Headache   . Hypertension   . Sleep disorder   . Visual disturbances     There are no problems to display for this patient.   Past Surgical History:  Procedure Laterality Date  . CHOLECYSTECTOMY    . COLONOSCOPY WITH PROPOFOL N/A 10/19/2017   Procedure: COLONOSCOPY WITH PROPOFOL;  Surgeon: Lollie Sails, MD;  Location: [facility] ENDOSCOPY;  Service: Endoscopy;  Laterality: N/A;    Prior to Admission medications   Medication Sig Start Date End Date Taking? Authorizing Provider  amLODipine-benazepril (LOTREL) 5-10 MG capsule Take 1 capsule by mouth daily. 01/25/17   [provider]  atorvastatin (LIPITOR) 20 MG tablet Take 20 mg by mouth daily. 11/18/16   [provider]  azithromycin (ZITHROMAX Z-PAK) 250 MG tablet Take 2 tablets (500 mg) on  Day 1,  followed by 1 tablet (250 mg) once daily on Days 2 through 5. 08/17/19   Johnn Hai, PA-C  cyclobenzaprine (FLEXERIL) 10 MG tablet Take 10 mg by mouth daily as needed for muscle spasms.    [provider]  glipiZIDE (GLUCOTROL XL) 10  MG 24 hr tablet Take 10 mg by mouth 2 (two) times daily. 11/08/16   [provider]  JANUVIA 100 MG tablet Take 100 mg by mouth daily. 12/04/16   [provider]  liraglutide (VICTOZA) 18 MG/3ML SOPN Inject 0.6 mg into the skin daily.    [provider]  lisinopril-hydrochlorothiazide (PRINZIDE,ZESTORETIC) 20-12.5 MG tablet Take 1 tablet by mouth daily.    [provider]  neomycin-polymyxin-pramoxine (NEOSPORIN PLUS) 1 % cream Apply topically 2 (two) times daily. 02/21/19   Sable Feil, PA-C    Allergies Patient has no known allergies.  Family History  Problem Relation Age of Onset  . Breast cancer Neg Hx     Social History Social History   Tobacco Use  . Smoking status: Never Smoker  . Smokeless tobacco: Never Used  Substance Use Topics  . Alcohol use: No  . Drug use: No    Review of Systems Constitutional: No fever/chills.  Positive for dizziness. Eyes: No visual changes. ENT: No sore throat. Cardiovascular: Denies chest pain. Respiratory: Denies shortness of breath.  Positive for cough. Gastrointestinal: No abdominal pain.  No nausea, no vomiting.  No diarrhea.  Positive decreased appetite. Genitourinary: Negative for dysuria. Musculoskeletal: Negative for back pain. Skin: Negative for rash. Neurological: Negative for headaches, focal weakness or numbness. ____________________________________________   PHYSICAL EXAM:  VITAL SIGNS: ED Triage Vitals  Enc Vitals Group     BP 08/17/19 0814 (!) 161/74  Pulse Rate 08/17/19 0814 97     Resp 08/17/19 0814 16     Temp 08/17/19 0814 99.8 F (37.7 C)     Temp Source 08/17/19 0814 Oral     SpO2 08/17/19 0814 97 %     Weight 08/17/19 0818 170 lb (77.1 kg)     Height --      Head Circumference --      Peak Flow --      Pain Score 08/17/19 0818 0     Pain Loc --      Pain Edu? --      Excl. in [place]? --     Constitutional: Alert and oriented. Well appearing and in no acute  distress.  Patient is able to speak with the interpreter in full sentences without any shortness of breath noted. Eyes: Conjunctivae are normal.  Head: Atraumatic. Neck: No stridor.   Cardiovascular: Normal rate, regular rhythm. Grossly normal heart sounds.  Good peripheral circulation. Respiratory: Normal respiratory effort.  No retractions. Lungs CTAB.  No coughing noted wall in the room. Gastrointestinal: Soft and nontender. No distention. No abdominal bruits. No CVA tenderness. Musculoskeletal: No lower extremity tenderness nor edema.  No joint effusions. Neurologic:  Normal speech and language. No gross focal neurologic deficits are appreciated. No gait instability. Skin:  Skin is warm, dry and intact. No rash noted. Psychiatric: Mood and affect are normal. Speech and behavior are normal.  ____________________________________________   LABS (all labs ordered are listed, but only abnormal results are displayed)  Labs Reviewed  BASIC METABOLIC PANEL - Abnormal; Notable for the following components:      Result Value   Glucose, Bld 106 (*)    Creatinine, Ser 1.26 (*)    Calcium 8.7 (*)    GFR calc non Af Amer 46 (*)    GFR calc Af Amer 53 (*)    All other components within normal limits  URINALYSIS, COMPLETE (UACMP) WITH MICROSCOPIC - Abnormal; Notable for the following components:   Color, Urine YELLOW (*)    APPearance HAZY (*)    Ketones, ur 5 (*)    Protein, ur 100 (*)    All other components within normal limits  CBC   ____________________________________________  EKG  EKG was reviewed by one of the doctors on the major side. Normal sinus rhythm with a ventricular rate of 93, PR interval 128, QRS duration 84. ____________________________________________  RADIOLOGY  Official radiology report(s): DG Chest Port 1 View  Result Date: 08/17/2019 CLINICAL DATA:  Weakness EXAM: PORTABLE CHEST 1 VIEW COMPARISON:  None. FINDINGS: The heart size and mediastinal contours are  within normal limits. Streaky right basilar airspace opacity. Left lung appears clear. No pleural effusion or pneumothorax. The visualized skeletal structures are unremarkable. IMPRESSION: Streaky right basilar airspace opacity suspicious for pneumonia. Radiographic follow-up to resolution is recommended. Electronically Signed   By: Davina Poke D.O.   On: 08/17/2019 09:21    ____________________________________________   PROCEDURES  Procedure(s) performed (including Critical Care):  Procedures   ____________________________________________   INITIAL IMPRESSION / ASSESSMENT AND PLAN / ED COURSE  As part of my medical decision making, I reviewed the following data within the electronic MEDICAL RECORD NUMBER Notes from prior ED visits and [place] [employer] Shipman was evaluated in Emergency Department on 08/17/2019 for the symptoms described in the history of present illness. She was evaluated in the context of the global COVID-19 pandemic, which necessitated consideration that the patient might be  at risk for infection with the SARS-CoV-2 virus that causes COVID-19. Institutional protocols and algorithms that pertain to the evaluation of patients at risk for COVID-19 are in a state of rapid change based on information released by regulatory bodies including the CDC and federal and state organizations. These policies and algorithms were followed during the patient's care in the ED.  64 year old female presents to the ED with complaint of dizziness and decreased appetite.  Patient also reports some nausea and productive cough.  She states that she was tested for Covid yesterday near the health department but has not received the results.  She states that her husband currently has the same symptoms and apparently has already been told that he is positive.  She is aware that she needs to quarantine at this time.  Note was written for her.  Chest x-ray does show evidence suspicious  for pneumonia.  Patient is non-insulin-dependent diabetic without complications.  A prescription for Zithromax was sent to her pharmacy and she is aware that this may not make a difference as the Covid virus does not respond to antibiotics.  She is to wait for the results of her Covid test.  ____________________________________________   FINAL CLINICAL IMPRESSION(S) / ED DIAGNOSES  Final diagnoses:  Pneumonia of right lower lobe due to infectious organism  Close exposure to COVID-19 virus     ED Discharge Orders         Ordered    azithromycin (ZITHROMAX Z-PAK) 250 MG tablet     08/17/19 0933           Note:  This document was prepared using Dragon voice recognition software and may include unintentional dictation errors.    Johnn Hai, PA-C 08/17/19 1033    Johnn Hai, PA-C 08/17/19 1033    Merlyn Lot, MD 08/17/19 1226

## 2019-08-17 NOTE — ED Triage Notes (Signed)
Pt here for nausea and feeling lightheaded.  Has had decreased appetite.  Had a covid test  Yesterday but does not have the results. No fever.  Has had yellow productive cough. ambulatory without difficulty. VSS. Unlabored.

## 2019-08-17 NOTE — Discharge Instructions (Signed)
Follow-up with your primary care provider if any continued problems.  Return to the emergency department immediately if any worsening of your symptoms.

## 2020-02-10 ENCOUNTER — Ambulatory Visit
Admission: RE | Admit: 2020-02-10 | Discharge: 2020-02-10 | Disposition: A | Discharge status: Home / Self Care | Payer: Commercial Managed Care - PPO | Source: Ambulatory Visit | Attending: Internal Medicine | Admitting: Internal Medicine

## 2020-02-10 DIAGNOSIS — Z1231 Encounter for screening mammogram for malignant neoplasm of breast: Secondary | ICD-10-CM | POA: Diagnosis present

## 2020-04-14 ENCOUNTER — Emergency Department: Payer: Commercial Managed Care - PPO

## 2020-04-14 ENCOUNTER — Emergency Department
Admission: EM | Admit: 2020-04-14 | Discharge: 2020-04-14 | Disposition: A | Discharge status: Home / Self Care | Payer: Commercial Managed Care - PPO | Attending: Emergency Medicine | Admitting: Emergency Medicine

## 2020-04-14 ENCOUNTER — Other Ambulatory Visit: Payer: Self-pay

## 2020-04-14 ENCOUNTER — Encounter: Payer: Self-pay | Admitting: Intensive Care

## 2020-04-14 DIAGNOSIS — Z5321 Procedure and treatment not carried out due to patient leaving prior to being seen by health care provider: Secondary | ICD-10-CM | POA: Insufficient documentation

## 2020-04-14 DIAGNOSIS — R410 Disorientation, unspecified: Secondary | ICD-10-CM | POA: Diagnosis not present

## 2020-04-14 LAB — CBC WITH DIFFERENTIAL/PLATELET
Abs Immature Granulocytes: 0.02 10*3/uL (ref 0.00–0.07)
Basophils Absolute: 0.1 10*3/uL (ref 0.0–0.1)
Basophils Relative: 1 %
Eosinophils Absolute: 0 10*3/uL (ref 0.0–0.5)
Eosinophils Relative: 0 %
HCT: 37.1 % (ref 36.0–46.0)
Hemoglobin: 12.2 g/dL (ref 12.0–15.0)
Immature Granulocytes: 0 %
Lymphocytes Relative: 20 %
Lymphs Abs: 1.6 10*3/uL (ref 0.7–4.0)
MCH: 31.5 pg (ref 26.0–34.0)
MCHC: 32.9 g/dL (ref 30.0–36.0)
MCV: 95.9 fL (ref 80.0–100.0)
Monocytes Absolute: 0.6 10*3/uL (ref 0.1–1.0)
Monocytes Relative: 7 %
Neutro Abs: 5.8 10*3/uL (ref 1.7–7.7)
Neutrophils Relative %: 72 %
Platelets: 261 10*3/uL (ref 150–400)
RBC: 3.87 MIL/uL (ref 3.87–5.11)
RDW: 13.8 % (ref 11.5–15.5)
WBC: 8.1 10*3/uL (ref 4.0–10.5)
nRBC: 0 % (ref 0.0–0.2)

## 2020-04-14 LAB — COMPREHENSIVE METABOLIC PANEL
ALT: 16 U/L (ref 0–44)
AST: 21 U/L (ref 15–41)
Albumin: 4.5 g/dL (ref 3.5–5.0)
Alkaline Phosphatase: 87 U/L (ref 38–126)
Anion gap: 9 (ref 5–15)
BUN: 14 mg/dL (ref 8–23)
CO2: 25 mmol/L (ref 22–32)
Calcium: 9.2 mg/dL (ref 8.9–10.3)
Chloride: 103 mmol/L (ref 98–111)
Creatinine, Ser: 1.17 mg/dL — ABNORMAL HIGH (ref 0.44–1.00)
GFR calc Af Amer: 57 mL/min — ABNORMAL LOW (ref >60)
GFR calc non Af Amer: 50 mL/min — ABNORMAL LOW (ref >60)
Glucose, Bld: 56 mg/dL — ABNORMAL LOW (ref 70–99)
Potassium: 3.9 mmol/L (ref 3.5–5.1)
Sodium: 137 mmol/L (ref 135–145)
Total Bilirubin: 0.5 mg/dL (ref 0.3–1.2)
Total Protein: 8.1 g/dL (ref 6.5–8.1)

## 2020-04-14 LAB — TROPONIN I (HIGH SENSITIVITY): Troponin I (High Sensitivity): 8 ng/L (ref <18)

## 2020-04-14 LAB — LACTIC ACID, PLASMA: Lactic Acid, Venous: 0.8 mmol/L (ref 0.5–1.9)

## 2020-04-14 NOTE — ED Triage Notes (Signed)
Interpretor used doing triage. Patients husband speaking for patient and reports she cannot answer any questions and has been intermittently confused since Monday. Also reports she woke up confused this AM  And she locked herself in the bathroom around 9am today and would not answer him or his son. Patient alert to self only in triage. Bilateral grips strong and equal. No weakness in legs. Denies pain

## 2021-03-22 ENCOUNTER — Other Ambulatory Visit: Payer: Self-pay | Admitting: Obstetrics and Gynecology

## 2021-03-22 DIAGNOSIS — Z1231 Encounter for screening mammogram for malignant neoplasm of breast: Secondary | ICD-10-CM

## 2021-04-07 ENCOUNTER — Ambulatory Visit
Admission: RE | Admit: 2021-04-07 | Discharge: 2021-04-07 | Disposition: A | Discharge status: Home / Self Care | Payer: Commercial Managed Care - PPO | Source: Ambulatory Visit | Attending: Obstetrics and Gynecology | Admitting: Obstetrics and Gynecology

## 2021-04-07 ENCOUNTER — Other Ambulatory Visit: Payer: Self-pay

## 2021-04-07 DIAGNOSIS — Z1231 Encounter for screening mammogram for malignant neoplasm of breast: Secondary | ICD-10-CM | POA: Diagnosis not present

## 2021-09-21 ENCOUNTER — Emergency Department
Admission: EM | Admit: 2021-09-21 | Discharge: 2021-09-21 | Disposition: A | Discharge status: Home / Self Care | Payer: Commercial Managed Care - PPO | Attending: Emergency Medicine | Admitting: Emergency Medicine

## 2021-09-21 ENCOUNTER — Other Ambulatory Visit: Payer: Self-pay

## 2021-09-21 ENCOUNTER — Encounter: Payer: Self-pay | Admitting: Emergency Medicine

## 2021-09-21 ENCOUNTER — Emergency Department: Payer: Commercial Managed Care - PPO

## 2021-09-21 DIAGNOSIS — E119 Type 2 diabetes mellitus without complications: Secondary | ICD-10-CM | POA: Insufficient documentation

## 2021-09-21 DIAGNOSIS — R059 Cough, unspecified: Secondary | ICD-10-CM | POA: Diagnosis present

## 2021-09-21 DIAGNOSIS — I10 Essential (primary) hypertension: Secondary | ICD-10-CM | POA: Diagnosis not present

## 2021-09-21 DIAGNOSIS — U071 COVID-19: Secondary | ICD-10-CM | POA: Diagnosis not present

## 2021-09-21 LAB — RESP PANEL BY RT-PCR (FLU A&B, COVID) ARPGX2
Influenza A by PCR: NEGATIVE
Influenza B by PCR: NEGATIVE
SARS Coronavirus 2 by RT PCR: POSITIVE — AB

## 2021-09-21 MED ORDER — BENZONATATE 100 MG PO CAPS: 100.0000 mg | ORAL_CAPSULE | Freq: Three times a day (TID) | ORAL | 0 refills | Status: AC | PRN

## 2021-09-21 MED ORDER — NIRMATRELVIR/RITONAVIR (PAXLOVID)TABLET: 3.0000 | ORAL_TABLET | Freq: Two times a day (BID) | ORAL | 0 refills | Status: AC

## 2021-09-21 NOTE — ED Provider Notes (Signed)
[facility] Provider Note    Event Date/Time   First MD Initiated Contact with Patient 09/21/21 0202     (approximate)   History   Cough   HPI  Michele Sanford is a 66 y.o. female with a past medical history of HTN and DM who presents for evaluation of 3 to 4 days of cough, congestion and body aches.  No associated chest pain, shortness of breath, fevers, vomiting, diarrhea, rash or urinary symptoms.  Patient denies any pulmonary or cardiac history.  She does not smoke.  No other acute complaints at this time.      Physical Exam  Triage Vital Signs: ED Triage Vitals [09/21/21 0043]  Enc Vitals Group     BP (!) 165/82     Pulse Rate 86     Resp 18     Temp 98.7 F (37.1 C)     Temp Source Oral     SpO2 97 %     Weight      Height      Head Circumference      Peak Flow      Pain Score 0     Pain Loc      Pain Edu?      Excl. in [place]?     Most recent vital signs: Vitals:   09/21/21 0043 09/21/21 0205  BP: (!) 165/82 (!) 173/75  Pulse: 86 82  Resp: 18 17  Temp: 98.7 F (37.1 C) 98.6 F (37 C)  SpO2: 97% 99%    General: Awake, no distress.  CV:  Good peripheral perfusion.  2+ radial pulses.  No murmurs rubs or gallops. Resp:  Normal effort.  Clear bilaterally. Abd:  No distention.  Soft throughout. Other:  Oropharynx is unremarkable.   ED Results / Procedures / Treatments  Labs (all labs ordered are listed, but only abnormal results are displayed) Labs Reviewed  RESP PANEL BY RT-PCR (FLU A&B, COVID) ARPGX2 - Abnormal; Notable for the following components:      Result Value   SARS Coronavirus 2 by RT PCR POSITIVE (*)    All other components within normal limits     EKG    RADIOLOGY Chest reviewed by myself shows no focal consoidation, effusion, edema, pneumothorax or other clear acute thoracic process. I also reviewed radiology interpretation and agree with findings described.    PROCEDURES:  Critical Care  performed: No  Procedures    MEDICATIONS ORDERED IN ED: Medications - No data to display   IMPRESSION / MDM / [place] / ED COURSE  I reviewed the triage vital signs and the nursing notes.                              Differential diagnosis includes, but is not limited to viral bronchitis versus pneumonia.  There is no findings on history or exam to suggest sepsis or other deep space infection in the head or neck, CHF, ACS or PE.  Chest reviewed by myself shows no focal consoidation, effusion, edema, pneumothorax or other clear acute thoracic process. I also reviewed radiology interpretation and agree with findings described.  COVID PCR is positive.  Influenza is negative.  I suspect an acute COVID infection is the cause of patient's symptoms.  She is in no respiratory distress and has no hypoxia and otherwise does not appear toxic.  I think she is appropriate for discharge and outpatient  follow-up.  Rx for Paxil bid and Tessalon written.  Advised to have blood pressure rechecked.  Discharged in stable condition.      FINAL CLINICAL IMPRESSION(S) / ED DIAGNOSES   Final diagnoses:  COVID  Hypertension, unspecified type     Rx / DC Orders   ED Discharge Orders     None        Note:  This document was prepared using Dragon voice recognition software and may include unintentional dictation errors.   Lucrezia Starch, MD 09/21/21 [phone]

## 2021-09-21 NOTE — ED Triage Notes (Signed)
Patient ambulatory to triage with steady gait, without difficulty or distress noted; pt reports cough, congestion and fever since Friday

## 2022-05-29 ENCOUNTER — Other Ambulatory Visit: Payer: Self-pay | Admitting: Internal Medicine

## 2022-05-29 DIAGNOSIS — Z1231 Encounter for screening mammogram for malignant neoplasm of breast: Secondary | ICD-10-CM

## 2022-06-01 ENCOUNTER — Ambulatory Visit
Admission: RE | Admit: 2022-06-01 | Discharge: 2022-06-01 | Disposition: A | Discharge status: Home / Self Care | Payer: BLUE CROSS/BLUE SHIELD | Source: Ambulatory Visit | Attending: Internal Medicine | Admitting: Internal Medicine

## 2022-06-01 DIAGNOSIS — Z1231 Encounter for screening mammogram for malignant neoplasm of breast: Secondary | ICD-10-CM | POA: Diagnosis not present

## 2023-09-28 ENCOUNTER — Ambulatory Visit: Admission: RE | Admit: 2023-09-28 | Payer: Medicare Other | Source: Home / Self Care

## 2023-09-28 ENCOUNTER — Encounter: Admission: RE | Payer: Self-pay | Source: Home / Self Care

## 2023-09-28 SURGERY — COLONOSCOPY WITH PROPOFOL

## 2023-12-13 ENCOUNTER — Encounter: Payer: Self-pay | Admitting: *Deleted

## 2023-12-21 ENCOUNTER — Ambulatory Visit: Admission: RE | Admit: 2023-12-21 | Payer: BLUE CROSS/BLUE SHIELD | Source: Home / Self Care

## 2023-12-21 HISTORY — DX: Chronic kidney disease, stage 3b: N18.32

## 2023-12-21 HISTORY — DX: Deficiency of other specified B group vitamins: E53.8

## 2023-12-21 HISTORY — DX: Personal history of COVID-19: Z86.16

## 2023-12-21 SURGERY — COLONOSCOPY WITH PROPOFOL
Anesthesia: General

## 2024-08-12 ENCOUNTER — Other Ambulatory Visit: Payer: Self-pay | Admitting: Internal Medicine

## 2024-08-12 DIAGNOSIS — Z1231 Encounter for screening mammogram for malignant neoplasm of breast: Secondary | ICD-10-CM

## 2024-09-05 ENCOUNTER — Encounter
